# Patient Record
Sex: Male | Born: 1980 | Hispanic: Yes | Marital: Married | Smoking: Current some day smoker
Health system: Southern US, Community
[De-identification: ages and names within clinical notes are randomized; demographics above are authoritative.]

## PROBLEM LIST (undated history)

## (undated) DIAGNOSIS — E7800 Pure hypercholesterolemia, unspecified: Secondary | ICD-10-CM

## (undated) DIAGNOSIS — I10 Essential (primary) hypertension: Secondary | ICD-10-CM

## (undated) HISTORY — PX: FINGER SURGERY: SHX640

---

## 2013-03-17 ENCOUNTER — Emergency Department (HOSPITAL_COMMUNITY): Payer: Worker's Compensation

## 2013-03-17 ENCOUNTER — Encounter (HOSPITAL_COMMUNITY): Payer: Self-pay

## 2013-03-17 ENCOUNTER — Emergency Department (HOSPITAL_COMMUNITY)
Admission: EM | Admit: 2013-03-17 | Discharge: 2013-03-17 | Disposition: A | Discharge status: Home / Self Care | Payer: Worker's Compensation | Attending: Emergency Medicine | Admitting: Emergency Medicine

## 2013-03-17 DIAGNOSIS — IMO0002 Reserved for concepts with insufficient information to code with codable children: Secondary | ICD-10-CM | POA: Insufficient documentation

## 2013-03-17 DIAGNOSIS — Y9389 Activity, other specified: Secondary | ICD-10-CM | POA: Insufficient documentation

## 2013-03-17 DIAGNOSIS — F17200 Nicotine dependence, unspecified, uncomplicated: Secondary | ICD-10-CM | POA: Insufficient documentation

## 2013-03-17 DIAGNOSIS — W2209XA Striking against other stationary object, initial encounter: Secondary | ICD-10-CM | POA: Insufficient documentation

## 2013-03-17 DIAGNOSIS — Y929 Unspecified place or not applicable: Secondary | ICD-10-CM | POA: Insufficient documentation

## 2013-03-17 DIAGNOSIS — Z23 Encounter for immunization: Secondary | ICD-10-CM | POA: Insufficient documentation

## 2013-03-17 MED ORDER — OXYCODONE-ACETAMINOPHEN 5-325 MG PO TABS: 2.0000 | ORAL_TABLET | ORAL | Status: DC | PRN

## 2013-03-17 MED ORDER — ONDANSETRON HCL 4 MG/2ML IJ SOLN
4.0000 mg | Freq: Once | INTRAMUSCULAR | Status: AC
  Administered 2013-03-17: 4 mg via INTRAVENOUS
  Filled 2013-03-17: qty 2

## 2013-03-17 MED ORDER — LIDOCAINE HCL (PF) 1 % IJ SOLN
INTRAMUSCULAR | Status: AC
  Filled 2013-03-17: qty 5

## 2013-03-17 MED ORDER — MORPHINE SULFATE 4 MG/ML IJ SOLN
4.0000 mg | Freq: Once | INTRAMUSCULAR | Status: AC
  Administered 2013-03-17: 4 mg via INTRAVENOUS
  Filled 2013-03-17: qty 1

## 2013-03-17 MED ORDER — CEPHALEXIN 500 MG PO CAPS: 500.0000 mg | ORAL_CAPSULE | Freq: Four times a day (QID) | ORAL | Status: DC

## 2013-03-17 MED ORDER — CEFAZOLIN SODIUM 1-5 GM-% IV SOLN
1.0000 g | Freq: Once | INTRAVENOUS | Status: AC
  Administered 2013-03-17: 1 g via INTRAVENOUS
  Filled 2013-03-17: qty 50

## 2013-03-17 MED ORDER — TETANUS-DIPHTH-ACELL PERTUSSIS 5-2.5-18.5 LF-MCG/0.5 IM SUSP
0.5000 mL | Freq: Once | INTRAMUSCULAR | Status: AC
  Administered 2013-03-17: 0.5 mL via INTRAMUSCULAR
  Filled 2013-03-17: qty 0.5

## 2013-03-17 NOTE — ED Notes (Signed)
Pt states he was changing a tie and cut his finger. Pt has avulsion to right index finger

## 2013-03-17 NOTE — ED Provider Notes (Signed)
CSN: [id number]     Arrival date & time 03/17/13  0865 History  This chart was scribed for Joya Gaskins, MD by Bennett Scrape, ED Scribe. This patient was seen in room APA03/APA03 and the patient's care was started at 7:16 AM.   Chief Complaint  Patient presents with  . Laceration  Patient gave verbal permission to utilize photo for medical documentation only The image was not stored on any personal device   Patient is a 32 y.o. male presenting with skin laceration. The history is provided by the patient. No language interpreter was used.  Laceration Location:  Finger Finger laceration location:  R little finger Quality: avulsion   Bleeding: controlled   Time since incident:  1 hour Laceration mechanism:  Blunt object (car jack) Pain details:    Severity:  Moderate   Timing:  Constant Foreign body present:  No foreign bodies Relieved by:  Nothing Tetanus status:  Out of date   HPI Comments: MORONI NESTER is a 32 y.o. male who presents to the Emergency Department complaining of right pinky laceration that occurred this morning around 6:15 AM. He was trying to change a tire and the car jack slipped and came down on his right pinky. He denies any recent fevers, chills, emesis and diarrhea. He denies having any chronic medical conditions and denies being on any daily medications. His TD vaccination is not UTD. He denies any known allergies to antibiotics.   PMH - none Pt speaks english without difficulty  History reviewed. No pertinent past surgical history. No family history on file. History  Substance Use Topics  . Smoking status: Current Some Day Smoker  . Smokeless tobacco: Not on file  . Alcohol Use: Yes    Review of Systems  Constitutional: Negative for fever.  Gastrointestinal: Negative for vomiting and diarrhea.  Skin: Positive for wound.  All other systems reviewed and are negative.    Allergies  Review of patient's allergies indicates no known  allergies.  Home Medications  No current outpatient prescriptions on file.  Triage Vitals: BP 168/127  Pulse 94  Temp(Src) 98.7 F (37.1 C) (Oral)  Resp 19  SpO2 100%  Physical Exam  Nursing note and vitals reviewed.  CONSTITUTIONAL: Well developed/well nourished HEAD: Normocephalic/atraumatic EYES: EOMI/PERRL ENMT: Mucous membranes moist NECK: supple no meningeal signs SPINE:entire spine nontender CV: S1/S2 noted, no murmurs/rubs/gallops noted LUNGS: Lungs are clear to auscultation bilaterally, no apparent distress NEURO: Pt is awake/alert, moves all extremitiesx4 EXTREMITIES: pulses normal, full ROM, see photo below SKIN: warm, color normal PSYCH: no abnormalities of mood noted        ED Course  Procedures   DIAGNOSTIC STUDIES: Oxygen Saturation is 100% on room air, normal by my interpretation.    COORDINATION OF CARE: 7:19 AM-Pt gave me permission to add a photo of the injury to his chart.  7:22 AM-Discussed treatment plan which includes morphine for pain control and consult with orthopedist with pt at bedside and pt agreed to plan.   Labs Review Labs Reviewed - No data to display Imaging Review Dg Finger Little Right  03/17/2013   CLINICAL DATA:  Injured changing a tire, amputation at tip of little finger  EXAM: RIGHT LITTLE FINGER 2+V  COMPARISON:  None  FINDINGS: Scattered dressing artifacts.  Soft tissue amputation through distal aspect of distal phalanx right little finger.  Comminuted fracture at tuft of distal phalanx.  Joint spaces preserved.  Osseous mineralization normal.  No additional fracture dislocation identified.  IMPRESSION: Partial amputation of the distal phalanx of the right little finger with a comminuted fracture involving the tuft of distal phalanx.   Electronically Signed   By: Ulyses Southward M.D.   On: 03/17/2013 08:10     Wound was cleansed extensively He was given antibiotics I spoke to dr Hilda Lias.  We discussed his exam as well as  imaging He recommends bandage with nonadherent dressing, antibiotics, pain control and will see in 3 days Pt is agreeable with this plan MDM  No diagnosis found. Nursing notes including past medical history and social history reviewed and considered in documentation xrays reviewed and considered   I personally performed the services described in this documentation, which was scribed in my presence. The recorded information has been reviewed and is accurate.       Joya Gaskins, MD 03/17/13 [phone]

## 2013-03-17 NOTE — ED Notes (Signed)
Wound cleansed with wound cleanser, soaked in NS and peroxide.  Dressed with xeroform, telfa and kling with tight dressing.  Instructions given for dressing change.  Pt verbalized understanding.

## 2013-05-30 ENCOUNTER — Ambulatory Visit (HOSPITAL_COMMUNITY)
Admission: RE | Admit: 2013-05-30 | Discharge: 2013-05-30 | Disposition: A | Discharge status: Home / Self Care | Payer: Worker's Compensation | Source: Ambulatory Visit | Attending: Orthopaedic Surgery | Admitting: Orthopaedic Surgery

## 2013-05-30 DIAGNOSIS — M25549 Pain in joints of unspecified hand: Secondary | ICD-10-CM | POA: Insufficient documentation

## 2013-05-30 DIAGNOSIS — S62639B Displaced fracture of distal phalanx of unspecified finger, initial encounter for open fracture: Secondary | ICD-10-CM | POA: Insufficient documentation

## 2013-05-30 DIAGNOSIS — M79641 Pain in right hand: Secondary | ICD-10-CM

## 2013-05-30 DIAGNOSIS — M6281 Muscle weakness (generalized): Secondary | ICD-10-CM | POA: Insufficient documentation

## 2013-05-30 DIAGNOSIS — M7989 Other specified soft tissue disorders: Secondary | ICD-10-CM | POA: Insufficient documentation

## 2013-05-30 DIAGNOSIS — IMO0001 Reserved for inherently not codable concepts without codable children: Secondary | ICD-10-CM | POA: Insufficient documentation

## 2013-05-30 DIAGNOSIS — S68116A Complete traumatic metacarpophalangeal amputation of right little finger, initial encounter: Secondary | ICD-10-CM | POA: Insufficient documentation

## 2013-05-30 NOTE — Evaluation (Signed)
Occupational Therapy Evaluation  Patient Details  Name: Jason Rich MRN: [id number] Date of Birth: March 28, 1981  Today's Date: 05/30/2013 Time: 1315-1350 OT Time Calculation (min): 35 min OT Evaluation 1315-1335 20' Hep 1335-1350 15'  Visit#: 1 of 12  Re-eval: 06/27/13  Assessment Diagnosis: S/P Right Small Finger Distal Phalanx Fracture and Amputation Next MD Visit: unknown Prior Therapy: n/a   Authorization: workers compensation   Authorization Time Period: 4 weeks (06/27/13)   Authorization Visit#: 1 of 12   Past Medical History: No past medical history on file. Past Surgical History: No past surgical history on file.  Subjective Symptoms/Limitations Symptoms: S:  It hurts to use my finger at all.  Pertinent History: Jason Rich was changing a tire at work and the jack slipped, fracturing his right small finger distal phalanx and amputating the tip of his small digit.  His accident occurred on 03/17/13.  He consulted with Dr. Hilda Lias and has been referred to occupational therapy for evaluation and treatment.   Special Tests: FOTO scored 42. Patient Stated Goals: To get rid of the pain.  Pain Assessment Currently in Pain?: Yes Pain Score: 7  Pain Location: Finger (Comment which one) (small) Pain Orientation: Right;Distal Pain Type: Acute pain  Precautions/Restrictions  Precautions Precautions: None Restrictions Weight Bearing Restrictions: No  Balance Screening Balance Screen Has the patient fallen in the past 6 months: No Has the patient had a decrease in activity level because of a fear of falling? : No Is the patient reluctant to leave their home because of a fear of falling? : No  Prior Functioning  Home Living Additional Comments: Patient lives with his wife and 2 children  Prior Function Driving: Yes Vocation: Full time employment Vocation Requirements: Armed forces operational officer Comments: enjoys spending time with his  kids  Assessment ADL/Vision/Perception ADL ADL Comments: unable to grip or squeeze with his right hand, unable to use tools and equipment at work. Dominant Hand: Right Praxis Praxis: Intact  Cognition/Observation Cognition Overall Cognitive Status: Within Functional Limits for tasks assessed Observation/Other Assessments Observations: tip of small finger amputated with mod-max scar tissue  Sensation/Coordination/Edema Sensation Light Touch:  (volar distal phalanx diminshed protective sensation, tip los) Additional Comments: volar distal phalanx diminshed protective sensation, tip loss of protective sensation  Coordination Gross Motor Movements are Fluid and Coordinated: Yes Fine Motor Movements are Fluid and Coordinated: Yes Edema Edema: PIPJ right 6.0 cm left 5.4 cm   Additional Assessments RUE AROM (degrees) RUE Overall AROM Comments: assessed in seated Right Wrist Extension: 50 Degrees Right Wrist Flexion: 80 Degrees Right Composite Finger Flexion:  (MCPJ 82 PIPJ 60 lacks 5 extension DIPJ 30) RUE Strength RUE Overall Strength Comments: assessed in seated 3 point pinch with small, ring, thumb Grip (lbs): 46 (125) Lateral Pinch: 12 lbs (22) 3 Point Pinch: 0.5 lbs (10) Palpation Palpation: moderate fascial restrictions and mod-max scar restrictions  Right Hand Strength - Pinch (lbs) Lateral Pinch: 12 lbs (22) 3 Point Pinch: 0.5 lbs (10)   Exercise/Treatments    Manual Therapy Manual Therapy: Edema management Edema Management: wrapped right small digit with coban for edema control and educated patient to complete at home, voiced understanding of technique. Myofascial Release: MFR and scar release to right small digit and hand  Occupational Therapy Assessment and Plan OT Assessment and Plan Clinical Impression Statement: A:  Patient presents s/p right distal digit amputation of right small finger.  His deficits are causing decreased independence with B/IADLs, work,  and leisure activities.   Pt will benefit  from skilled therapeutic intervention in order to improve on the following deficits: Decreased skin integrity;Decreased strength;Increased edema;Impaired sensation;Decreased range of motion;Pain Rehab Potential: Good OT Frequency: Min 2X/week OT Duration: 6 weeks OT Treatment/Interventions: Self-care/ADL training;Therapeutic exercise;Neuromuscular education;Splinting;Modalities;Manual therapy;Therapeutic activities;Patient/family education OT Plan: P: Skilled OT intervention to decrease pain, restrictions, edema, and hypersensitvity and improve AROM, strength, and functional use of RUE with daily activities.  Treatment Plan:  MFR and scar massage, edema control, desensitization/sensory reintegration, PROM progressing to AROM, grip activities.     Goals Short Term Goals Time to Complete Short Term Goals: 3 weeks Short Term Goal 1: Patient will be educated on a HEP. Short Term Goal 2: Patient will improve PROM of right small finger and wrist to [facility] for increased ability to grasp work tools.  Short Term Goal 3: Patient will improve right grip strength by 20 pounds and pinch strength by 3 pounds for increased abiilty to open containers. Short Term Goal 4: Patient will improve sensation to dulled light touch sensation throughout small digit for increased safety at work. Short Term Goal 5: Patient will decrease edema by .3 cm in his right small finger. Additional Short Term Goals?: Yes Short Term Goal 6: Patient will decrease pain to 5/10 in his right small finger while working.  Short Term Goal 7: Patient will decrease fascial restrictions to minimal in his right hand.  Long Term Goals Time to Complete Long Term Goals: 6 weeks Long Term Goal 1: Patient will return to prior level of independence with all daily, work, and leisure activities.  Long Term Goal 2: Patient will improve AROM of right small finger and wrist to [facility] for increased ability to grasp work  tools.  Long Term Goal 3: Patient will improve right grip strength by 50 pounds and pinch strength by 7 pounds for increased abiilty to manipulate power tools. Long Term Goal 4: Patient will improve sensation to intact hroughout small digit for increased safety at work. Long Term Goal 5: Patient will decrease edema by .6 cm in his right small finger. Additional Long Term Goals?: Yes Long Term Goal 6: Patient will decrease pain to 3/10 in his right small finger while working.  Long Term Goal 7: Patient will decrease fascial restrictions to trace in his right hand.   Problem List Patient Active Problem List   Diagnosis Date Noted  . 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 Fracture of finger, distal phalanx, right, open 05/30/2013  . Pain in joint, hand 05/30/2013    End of Session Activity Tolerance: Patient tolerated treatment well General Behavior During Therapy: [facility] for tasks assessed/performed OT Plan of Care OT Home Exercise Plan: tendon glides, sensory, scar massage, edema control, AROM  OT Patient Instructions: scanned Consulted and Agree with Plan of Care: Patient  GO    Shirlean Mylar, OTR/L  05/30/2013, 5:58 PM  Physician Documentation Your signature is required to indicate approval of the treatment plan as stated above.  Please sign and either send electronically or make a copy of this report for your files and return this physician signed original.  Please mark one 1.__approve of plan  2. ___approve of plan with the following conditions.   ______________________________  _____________________ Physician Signature                                                                                                             Date

## 2013-06-01 ENCOUNTER — Ambulatory Visit (HOSPITAL_COMMUNITY): Payer: Self-pay | Admitting: Specialist

## 2013-06-06 ENCOUNTER — Ambulatory Visit (HOSPITAL_COMMUNITY)
Admission: RE | Admit: 2013-06-06 | Discharge: 2013-06-06 | Disposition: A | Discharge status: Home / Self Care | Payer: Worker's Compensation | Source: Ambulatory Visit | Attending: *Deleted | Admitting: *Deleted

## 2013-06-06 DIAGNOSIS — M25549 Pain in joints of unspecified hand: Secondary | ICD-10-CM | POA: Insufficient documentation

## 2013-06-06 DIAGNOSIS — M6281 Muscle weakness (generalized): Secondary | ICD-10-CM | POA: Insufficient documentation

## 2013-06-06 DIAGNOSIS — IMO0001 Reserved for inherently not codable concepts without codable children: Secondary | ICD-10-CM | POA: Insufficient documentation

## 2013-06-06 NOTE — Progress Notes (Signed)
Occupational Therapy Treatment Patient Details  Name: Jason Rich MRN: [id number] Date of Birth: [birth year]  Today's Date: 06/06/2013 Time: 7829-5621 OT Time Calculation (min): 40 min Manual 1523-1540 (17') Selfcare 1540-1550 (10') TherExercises 1550-1603 (13')  Visit#: 2 of 12  Re-eval: 06/27/13    Authorization: workers compensation   Authorization Time Period: 4 weeks (06/27/13)   Authorization Visit#: 2 of 12  Subjective Symptoms/Limitations Symptoms: S:  I think It hurts a little more now than it did when it happened  Pain Assessment Currently in Pain?: Yes Pain Score: 9  Pain Location: Finger (Comment which one) (right fifth digit ) Pain Orientation: Right;Distal Pain Type: Acute pain Multiple Pain Sites: No   Exercise/Treatments Hand Exercises MCPJ Flexion: PROM;AAROM;10 reps MCPJ Extension: PROM;AAROM;10 reps PIPJ Flexion: PROM;AROM;10 reps PIPJ Extension: PROM;AAROM;10 reps DIPJ Flexion: PROM;AAROM;10 reps DIPJ Extension: PROM;AAROM;10 reps Digit Composite Abduction: AAROM;10 reps Digit Composite Adduction: AAROM;10 reps Opposition: AAROM;10 reps     Manual Therapy Myofascial Release: MFR and scar release to right small digit and hand Splinting Splinting: #1 stockinette with knotted end applied to 5th digit for desensitization.  Educated on contrast bath for home for edema management and sensitivity.   Occupational Therapy Assessment and Plan OT Assessment and Plan Clinical Impression Statement: A:  Patient with continued hypersensitivity of distal 5th digit.  Demos increased range of motion  OT Plan: P:  MFR and scar massage, edema control, desensitization/sensory reintegration, PROM progressing to AROM, grip activities.     Goals Short Term Goals Short Term Goal 1: Patient will be educated on a HEP. Short Term Goal 1 Progress: Progressing toward goal Short Term Goal 2: Patient will improve PROM of right small finger and wrist to [facility] for increased  ability to grasp work tools.  Short Term Goal 2 Progress: Progressing toward goal Short Term Goal 3: Patient will improve right grip strength by 20 pounds and pinch strength by 3 pounds for increased abiilty to open containers. Short Term Goal 3 Progress: Progressing toward goal Short Term Goal 4: Patient will improve sensation to dulled light touch sensation throughout small digit for increased safety at work. Short Term Goal 4 Progress: Progressing toward goal Short Term Goal 5: Patient will decrease edema by .3 cm in his right small finger. Short Term Goal 5 Progress: Progressing toward goal Additional Short Term Goals?: Yes Short Term Goal 6: Patient will decrease pain to 5/10 in his right small finger while working.  Short Term Goal 6 Progress: Progressing toward goal Short Term Goal 7: Patient will decrease fascial restrictions to minimal in his right hand.  Short Term Goal 7 Progress: Progressing toward goal Long Term Goals Long Term Goal 1: Patient will return to prior level of independence with all daily, work, and leisure activities.  Long Term Goal 1 Progress: Progressing toward goal Long Term Goal 2: Patient will improve AROM of right small finger and wrist to [facility] for increased ability to grasp work tools.  Long Term Goal 2 Progress: Progressing toward goal Long Term Goal 3: Patient will improve right grip strength by 50 pounds and pinch strength by 7 pounds for increased abiilty to manipulate power tools. Long Term Goal 3 Progress: Progressing toward goal Long Term Goal 4: Patient will improve sensation to intact hroughout small digit for increased safety at work. Long Term Goal 4 Progress: Progressing toward goal Long Term Goal 5: Patient will decrease edema by .6 cm in his right small finger. Long Term Goal 5 Progress: Progressing toward goal Additional  Long Term Goals?: Yes Long Term Goal 6: Patient will decrease pain to 3/10 in his right small finger while working.  Long  Term Goal 6 Progress: Progressing toward goal Long Term Goal 7: Patient will decrease fascial restrictions to trace in his right hand.  Long Term Goal 7 Progress: Progressing toward goal  Problem List Patient Active Problem List   Diagnosis Date Noted  . 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 Fracture of finger, distal phalanx, right, open 05/30/2013  . Pain in joint, hand 05/30/2013    End of Session Activity Tolerance: Patient tolerated treatment well General Behavior During Therapy: [facility] for tasks assessed/performed  GO    Velora Mediate, OTR/L 06/06/2013, 5:17 PM

## 2013-06-13 ENCOUNTER — Ambulatory Visit (HOSPITAL_COMMUNITY): Payer: Self-pay | Admitting: Occupational Therapy

## 2013-06-16 ENCOUNTER — Ambulatory Visit (HOSPITAL_COMMUNITY)
Admission: RE | Admit: 2013-06-16 | Discharge: 2013-06-16 | Disposition: A | Discharge status: Home / Self Care | Payer: Worker's Compensation | Source: Ambulatory Visit | Attending: Orthopaedic Surgery | Admitting: Orthopaedic Surgery

## 2013-06-16 DIAGNOSIS — X58XXXA Exposure to other specified factors, initial encounter: Secondary | ICD-10-CM | POA: Insufficient documentation

## 2013-06-16 DIAGNOSIS — S68116A Complete traumatic metacarpophalangeal amputation of right little finger, initial encounter: Secondary | ICD-10-CM

## 2013-06-16 DIAGNOSIS — M79609 Pain in unspecified limb: Secondary | ICD-10-CM | POA: Insufficient documentation

## 2013-06-16 DIAGNOSIS — IMO0001 Reserved for inherently not codable concepts without codable children: Secondary | ICD-10-CM | POA: Insufficient documentation

## 2013-06-16 DIAGNOSIS — S68118A Complete traumatic metacarpophalangeal amputation of other finger, initial encounter: Secondary | ICD-10-CM | POA: Insufficient documentation

## 2013-06-16 DIAGNOSIS — M25549 Pain in joints of unspecified hand: Secondary | ICD-10-CM

## 2013-06-16 DIAGNOSIS — S62639B Displaced fracture of distal phalanx of unspecified finger, initial encounter for open fracture: Secondary | ICD-10-CM | POA: Insufficient documentation

## 2013-06-16 NOTE — Progress Notes (Signed)
Occupational Therapy Treatment Patient Details  Name: Jason SkeenBenito Rich MRN: [id number] Date of Birth: 02/27/1981  Today's Date: 06/16/2013 Time: 1435-1510 OT Time Calculation (min): 35 min Manual therapy [id number] 20' Therapeutic exercises 1455-1510 15'  Visit#: 3 of 12  Re-eval: 06/27/13    Authorization: workers compensation   Authorization Time Period: 4 weeks (06/27/13)   Authorization Visit#: 3 of 12  Subjective S:  I dont wrap it all the time.  It hurts when I unwrap it.  Pain Assessment Currently in Pain?: Yes Pain Score: 9  Pain Location: Finger (Comment which one) (right small digit)  Precautions/Restrictions   progress as tolerated  Exercise/Treatments Hand Exercises MCPJ Flexion: PROM;AROM;10 reps MCPJ Extension: PROM;AROM;10 reps PIPJ Flexion: PROM;AROM;10 reps PIPJ Extension: PROM;AROM;10 reps DIPJ Flexion: PROM;AROM;10 reps DIPJ Extension: PROM;AROM;10 reps Joint Blocking Exercises: P/AROM each joint of small finger x 5 Digit Composite Abduction: AROM;Strengthening;10 reps (AROM with sponge, strengthening with pink putty) Digit Composite Adduction: AROM;10 reps Theraputty: Flatten;Roll;Grip;Pinch Theraputty - Flatten: pink - vg to utilize small digit Theraputty - Roll: pink Theraputty - Grip: pink - gripping in supinated and pronated position Theraputty - Pinch: pinch with small, ring, thumb combo Sponges: 11, vg to use small finger Sensory Retraining: used qtip to apply pressure to fingertip scar region for sensory input and desensitization and educated for HEP.     Manual Therapy Manual Therapy: Edema management Edema Management: reeducated patient on proper wrapping technique (distal to proximal) for edema control.  Reeducated on importance of wrapping on continual basis to maintain control of edema vs allowing swelling to increase and then wrapping.  Myofascial Release: MFR and scar release to right small digit and hand  Occupational Therapy Assessment  and Plan OT Assessment and Plan Clinical Impression Statement: A:  With active grasp of an object, small finger overlaps ring finger.  Patient completed 10 reps of digit abduction and then repeated grasp activity with improved alignment of digits noted.  OT Plan: P:  Follow up on HEP of scar desensitization and use of coban for edema control on a continual basis.  Improve grasp by grasping greater than 11 sponges at one time.    Goals Short Term Goals Short Term Goal 1: Patient will be educated on a HEP. Short Term Goal 1 Progress: Progressing toward goal Short Term Goal 2: Patient will improve PROM of right small finger and wrist to [facility] for increased ability to grasp work tools.  Short Term Goal 2 Progress: Progressing toward goal Short Term Goal 3: Patient will improve right grip strength by 20 pounds and pinch strength by 3 pounds for increased abiilty to open containers. Short Term Goal 3 Progress: Progressing toward goal Short Term Goal 4: Patient will improve sensation to dulled light touch sensation throughout small digit for increased safety at work. Short Term Goal 4 Progress: Progressing toward goal Short Term Goal 5: Patient will decrease edema by .3 cm in his right small finger. Short Term Goal 5 Progress: Progressing toward goal Additional Short Term Goals?: Yes Short Term Goal 6: Patient will decrease pain to 5/10 in his right small finger while working.  Short Term Goal 6 Progress: Progressing toward goal Short Term Goal 7: Patient will decrease fascial restrictions to minimal in his right hand.  Short Term Goal 7 Progress: Progressing toward goal Long Term Goals Long Term Goal 1: Patient will return to prior level of independence with all daily, work, and leisure activities.  Long Term Goal 1 Progress: Progressing toward goal Long Term  Goal 2: Patient will improve AROM of right small finger and wrist to [facility] for increased ability to grasp work tools.  Long Term Goal 2  Progress: Progressing toward goal Long Term Goal 3: Patient will improve right grip strength by 50 pounds and pinch strength by 7 pounds for increased abiilty to manipulate power tools. Long Term Goal 3 Progress: Progressing toward goal Long Term Goal 4: Patient will improve sensation to intact hroughout small digit for increased safety at work. Long Term Goal 4 Progress: Progressing toward goal Long Term Goal 5: Patient will decrease edema by .6 cm in his right small finger. Long Term Goal 5 Progress: Progressing toward goal Additional Long Term Goals?: Yes Long Term Goal 6: Patient will decrease pain to 3/10 in his right small finger while working.  Long Term Goal 6 Progress: Progressing toward goal Long Term Goal 7: Patient will decrease fascial restrictions to trace in his right hand.  Long Term Goal 7 Progress: Progressing toward goal  Problem List Patient Active Problem List   Diagnosis Date Noted  . 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 Fracture of finger, distal phalanx, right, open 05/30/2013  . Pain in joint, hand 05/30/2013    End of Session Activity Tolerance: Patient tolerated treatment well General Behavior During Therapy: [facility] for tasks assessed/performed OT Plan of Care OT Home Exercise Plan: reviewed coban wrap, technique, and use of coban on continual basis. Consulted and Agree with Plan of Care: Patient  GO    Shirlean Mylar, OTR/L  06/16/2013, 3:26 PM

## 2013-06-20 ENCOUNTER — Ambulatory Visit (HOSPITAL_COMMUNITY)
Admission: RE | Admit: 2013-06-20 | Discharge: 2013-06-20 | Disposition: A | Discharge status: Home / Self Care | Payer: Worker's Compensation | Source: Ambulatory Visit | Attending: Occupational Therapy | Admitting: Occupational Therapy

## 2013-06-20 NOTE — Progress Notes (Signed)
Occupational Therapy Treatment Patient Details  Name: Jason Rich MRN: [id number] Date of Birth: 01/08/1981  Today's Date: 06/20/2013 Time: [id number] OT Time Calculation (min): 36 min Manual 1517-1530 (13') Therapeutic Exercises 1530-1553 (23')  Visit#: 4 of 12  Re-eval: 06/27/13   Authorization: workers compensation   Authorization Time Period: 4 weeks (06/27/13)   Authorization Visit#: 4 of 12  Subjective Symptoms/Limitations Symptoms: S: It's feeling better today - but I'm also starting to feel more with it." Pain Assessment Currently in Pain?: Yes Pain Score: 9  Pain Location: Finger (Comment which one) Pain Orientation: Right;Distal Pain Type: Acute pain Effect of Pain on Daily Activities: Just finished work outside in the cold - increases pain  Exercise/Treatments Hand Exercises MCPJ Flexion: PROM;AROM;10 reps MCPJ Extension: PROM;AROM;10 reps PIPJ Flexion: PROM;AROM;10 reps PIPJ Extension: PROM;AROM;10 reps DIPJ Flexion: PROM;AROM;10 reps DIPJ Extension: PROM;AROM;10 reps Digit Composite Abduction: AROM;Strengthening;20 reps Opposition: AAROM;AROM;15 reps (Opposed 1st/5th over pipecleaner - pulled through 5x) Theraputty: Flatten;Roll;Grip;Pinch Theraputty - Flatten: pink - vg to utilize small digit Theraputty - Pinch: pinch with small, ring, thumb combo     Manual Therapy Manual Therapy: Edema management (carpal stretches to facilitate opposition) Myofascial Release: MFR and scar release to right small digit and hand Splinting Splinting: digisleeve given to soften scar tissue and decrease swelling in R 5th digit  Occupational Therapy Assessment and Plan OT Assessment and Plan Clinical Impression Statement: A: Pt noted that he is having more sensation in his R 5th digit, but that sensation is currently pain. Pt is showing improved sensation tolerance duirng tx session. W cont abduction exercises, digit alignment during grasp has improved.  Pt tolerated well  digit opposition task with pipecleaner. OT Plan: P: Follow up on usage/effects of digitsleeve for scar softening and edema control. Improve grasp by grasping greater than 11 sponges at one time.   Goals Short Term Goals Short Term Goal 1: Patient will be educated on a HEP. Short Term Goal 1 Progress: Progressing toward goal Short Term Goal 2: Patient will improve PROM of right small finger and wrist to [facility] for increased ability to grasp work tools.  Short Term Goal 2 Progress: Progressing toward goal Short Term Goal 3: Patient will improve right grip strength by 20 pounds and pinch strength by 3 pounds for increased abiilty to open containers. Short Term Goal 3 Progress: Progressing toward goal Short Term Goal 4: Patient will improve sensation to dulled light touch sensation throughout small digit for increased safety at work. Short Term Goal 4 Progress: Progressing toward goal Short Term Goal 5: Patient will decrease edema by .3 cm in his right small finger. Short Term Goal 5 Progress: Progressing toward goal Additional Short Term Goals?: Yes Short Term Goal 6: Patient will decrease pain to 5/10 in his right small finger while working.  Short Term Goal 6 Progress: Progressing toward goal Short Term Goal 7: Patient will decrease fascial restrictions to minimal in his right hand.  Short Term Goal 7 Progress: Progressing toward goal Long Term Goals Long Term Goal 1: Patient will return to prior level of independence with all daily, work, and leisure activities.  Long Term Goal 2: Patient will improve AROM of right small finger and wrist to [facility] for increased ability to grasp work tools.  Long Term Goal 3: Patient will improve right grip strength by 50 pounds and pinch strength by 7 pounds for increased abiilty to manipulate power tools. Long Term Goal 4: Patient will improve sensation to intact hroughout small digit for  increased safety at work. Long Term Goal 5: Patient will decrease edema by  .6 cm in his right small finger. Additional Long Term Goals?: Yes Long Term Goal 6: Patient will decrease pain to 3/10 in his right small finger while working.  Long Term Goal 7: Patient will decrease fascial restrictions to trace in his right hand.   Problem List Patient Active Problem List   Diagnosis Date Noted  . 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 Fracture of finger, distal phalanx, right, open 05/30/2013  . Pain in joint, hand 05/30/2013    End of Session Activity Tolerance: Patient tolerated treatment well General Behavior During Therapy: [facility] for tasks assessed/performed  GO   Marry Guan, MS, OTR/L [phone]  06/20/2013, 4:17 PM

## 2013-06-22 ENCOUNTER — Ambulatory Visit (HOSPITAL_COMMUNITY): Payer: Self-pay | Admitting: Specialist

## 2013-06-27 ENCOUNTER — Ambulatory Visit (HOSPITAL_COMMUNITY): Payer: Self-pay | Admitting: Occupational Therapy

## 2013-06-29 ENCOUNTER — Ambulatory Visit (HOSPITAL_COMMUNITY): Payer: Self-pay | Admitting: Specialist

## 2013-07-04 ENCOUNTER — Ambulatory Visit (HOSPITAL_COMMUNITY)
Admission: RE | Admit: 2013-07-04 | Discharge: 2013-07-04 | Disposition: A | Discharge status: Home / Self Care | Payer: Worker's Compensation | Source: Ambulatory Visit | Attending: *Deleted | Admitting: *Deleted

## 2013-07-04 NOTE — Evaluation (Signed)
Occupational Therapy Re-Evaluation  Patient Details  Name: Jason Rich MRN: [id number] Date of Birth: Oct 23, 1980  Today's Date: 07/04/2013 Time: 1518-1600 OT Time Calculation (min): 42 min Reassessment 1518-1536 (18') Self Care 1536-1600 (24')  Visit#: 5 of 12  Re-eval: 07/25/13     Authorization: workers compensation   Authorization Time Period: 4 weeks (06/27/13) - to be reassessed with case manager pending MD appt 07/06/2013  Authorization Visit#: 5 of 12   Past Medical History: No past medical history on file. Past Surgical History: No past surgical history on file.  Subjective Symptoms/Limitations Symptoms: S:  When it is cold i have pain all the time... it is warm today so it is good.  ( patient states when it is cold outside and he hits anything with his right 5th digit, or ie. hammer, he feels and electric shock through his entire hand) Pain Assessment Currently in Pain?: Yes Pain Score: 8  (with bending movement of digit flexion ) Pain Location: Finger (Comment which one) (right fifth digit) Pain Orientation: Right Pain Type: Acute pain  Precautions/Restrictions  Precautions Precautions: None Restrictions Weight Bearing Restrictions: No  Balance Screening Balance Screen Has the patient fallen in the past 6 months: No   Assessment    07/04/13 1500  Assessment  Diagnosis S/P Right Small Finger Distal Phalanx Fracture and Amputation  Next MD Visit 05/06/2014  Precautions  Precautions None  Restrictions  Weight Bearing Restrictions No  Balance Screen  Has the patient fallen in the past 6 months No  ADL  ADL Comments patient states she he grasps objects tightly he feels pain through the ulnar side of his hand   Cognition  Overall Cognitive Status Within Functional Limits for tasks assessed  Observation/Other Assessments  Observations well healed tip with medial aspect of finger nail digging in his skin   Sensation  Additional Comments continues with  loss of sensation distal tip right 5th digit  Coordination  Gross Motor Movements are Fluid and Coordinated Yes  Fine Motor Movements are Fluid and Coordinated Yes  Edema  Edema PIPJ right 5.8 (6.0) left 5.2 (5.4);  DIPJ right 4.8, left 4.6  RUE AROM (degrees)  RUE Overall AROM Comments assessed in seated  Right Wrist Extension 76 Degrees (50)  Right Wrist Flexion 78 Degrees (80)  Right Composite Finger Flexion (R 5th MCPJ 86; PIP 88/-3; DIP 74)  RUE Strength  RUE Overall Strength Comments assessed in seated 3 point pinch with small, ring, thumb  Grip (lbs) 89 (48 (left 118 (125)))  Lateral Pinch 25 lbs (12 (left 22 (22)))  3 Point Pinch 8 lbs (0.5 (left 10 (10)))  Palpation  Palpation trace-min fascial restrictions and mod-max scar restrictions   Written Expression  Dominant Hand Right    Exercise/Treatments Hand Exercises Sensory Retraining: use of various textures (towels, pipecleaner, denim, sweatshirt material); tapping with thumb, on table, in putty     Activities of Daily Living Activities of Daily Living: educated patient on managing nail to prevent snagging, for pain management as well as encouraging patient to continue desensitization at home.   Occupational Therapy Assessment and Plan OT Assessment and Plan Clinical Impression Statement: A:  Reassessment completed this date.  Patient has met 5/7 STG, partially meeting sensation goal and not meeting pain goal;  Met 4/7 LTG, partially meeting stregth goal and not meeting pain or sensation goals.  He continues wtih decreased sensation of distal tip of right 5th digit and reports continued increased pain with temperature extremes and vibration/impact  to dominant right hand (ie. utilizing work tools).  Performed, instructed on sensitization techniques this date to continue with progress of regaining distal sensation.  Patient reports being able to engage in and actively perform all ADL, work and leisure activities with pain.   Patient for MD appointment this week.   OT Plan: P:  Per MD, pending appointment.  Will follow with case manager as needed.    Goals Short Term Goals Short Term Goal 1: Patient will be educated on a HEP. Short Term Goal 1 Progress: Met Short Term Goal 2: Patient will improve PROM of right small finger and wrist to [facility] for increased ability to grasp work tools.  Short Term Goal 2 Progress: Met Short Term Goal 3: Patient will improve right grip strength by 20 pounds and pinch strength by 3 pounds for increased abiilty to open containers. Short Term Goal 3 Progress: Met Short Term Goal 4: Patient will improve sensation to dulled light touch sensation throughout small digit for increased safety at work. (patient reports continued numbness at very distal tip of digit, however hypersensitivity has improved.  ) Short Term Goal 4 Progress: Partly met Short Term Goal 5: Patient will decrease edema by .3 cm in his right small finger. Short Term Goal 5 Progress: Met Short Term Goal 6: Patient will decrease pain to 5/10 in his right small finger while working.  (8/10) Short Term Goal 6 Progress: Not met Short Term Goal 7: Patient will decrease fascial restrictions to minimal in his right hand.  Short Term Goal 7 Progress: Met Long Term Goals Long Term Goal 1: Patient will return to prior level of independence with all daily, work, and leisure activities.  Long Term Goal 1 Progress: Met Long Term Goal 2: Patient will improve AROM of right small finger and wrist to [facility] for increased ability to grasp work tools.  Long Term Goal 2 Progress: Met Long Term Goal 3: Patient will improve right grip strength by 50 pounds and pinch strength by 7 pounds for increased abiilty to manipulate power tools. Long Term Goal 3 Progress: Partly met Long Term Goal 4: Patient will improve sensation to intact hroughout small digit for increased safety at work. Long Term Goal 4 Progress: Not met Long Term Goal 5: Patient  will decrease edema by .6 cm in his right small finger. Long Term Goal 5 Progress: Met Long Term Goal 6: Patient will decrease pain to 3/10 in his right small finger while working.  Long Term Goal 6 Progress: Not met Long Term Goal 7: Patient will decrease fascial restrictions to trace in his right hand.  Long Term Goal 7 Progress: Met  Problem List Patient Active Problem List   Diagnosis Date Noted  . 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 Fracture of finger, distal phalanx, right, open 05/30/2013  . Pain in joint, hand 05/30/2013    End of Session Activity Tolerance: Patient tolerated treatment well General Behavior During Therapy: [facility] for tasks assessed/performed  GO    Donney Rankins, OTR/L 07/04/2013, 7:46 PM  Physician Documentation Your signature is required to indicate approval of the treatment plan as stated above.  Please sign and either send electronically or make a copy of this report for your files and return this physician signed original.  Please mark one 1.__approve of plan  2. ___approve of plan with the following conditions.   ______________________________  _____________________ Physician Signature                                                                                                             Date

## 2013-07-10 ENCOUNTER — Inpatient Hospital Stay (HOSPITAL_COMMUNITY): Admission: RE | Admit: 2013-07-10 | Payer: Self-pay | Source: Ambulatory Visit | Admitting: Occupational Therapy

## 2014-06-09 ENCOUNTER — Emergency Department (HOSPITAL_COMMUNITY)
Admission: EM | Admit: 2014-06-09 | Discharge: 2014-06-09 | Disposition: A | Discharge status: Home / Self Care | Payer: Self-pay | Attending: Emergency Medicine | Admitting: Emergency Medicine

## 2014-06-09 ENCOUNTER — Encounter (HOSPITAL_COMMUNITY): Payer: Self-pay | Admitting: Emergency Medicine

## 2014-06-09 DIAGNOSIS — M79644 Pain in right finger(s): Secondary | ICD-10-CM | POA: Insufficient documentation

## 2014-06-09 DIAGNOSIS — Z792 Long term (current) use of antibiotics: Secondary | ICD-10-CM | POA: Insufficient documentation

## 2014-06-09 DIAGNOSIS — G8911 Acute pain due to trauma: Secondary | ICD-10-CM | POA: Insufficient documentation

## 2014-06-09 DIAGNOSIS — Z87828 Personal history of other (healed) physical injury and trauma: Secondary | ICD-10-CM | POA: Insufficient documentation

## 2014-06-09 DIAGNOSIS — Z720 Tobacco use: Secondary | ICD-10-CM | POA: Insufficient documentation

## 2014-06-09 MED ORDER — OXYCODONE-ACETAMINOPHEN 5-325 MG PO TABS: 1.0000 | ORAL_TABLET | ORAL | Status: DC | PRN

## 2014-06-09 NOTE — Discharge Instructions (Signed)
Dolor neuroptico (Neuropathic Pain) A menudo creemos que el dolor tiene una causa fsica. Si eliminamos la causa, el dolor debera irse. Los nervios en s mismos tambin pueden causar dolor. Esto se denomina dolor neuroptico, que implica una anormalidad del nervio. Puede ser difcil para los pacientes que lo padecen y para los profesionales que los asisten. El dolor normalmente se describe como agudo (de corta duracin) o crnico (de larga duracin). El dolor agudo se relaciona con las sensaciones fsicas que puede provocar una lesin. Puede durar unos pocos segundos o muchas semanas, pero a menudo desaparece cuando la lesin se cura. El dolor crnico dura ms que el tiempo normal de curacin. En el dolor neuroptico, las fibras nerviosas pueden lesionarse o daarse. Entonces envan seales incorrectas a otros centros de dolor. El dolor que se siente es real, pero la causa no es fcil de encontrar.  CAUSAS El dolor crnico puede ser resultado de enfermedades como la diabetes y el herpes (una infeccin relacionada con la varicela), o debido a un traumatismo, una ciruga o una amputacin. Tambin puede ocurrir sin ninguna enfermedad o lesin conocida. Los nervios envan mensajes de dolor, an cuando no haya una causa identificable de tales mensajes.   Otras causas comunes de la neuropata incluyen diabetes, sndrome del miembro fantasma, o Sndrome de Dolor Regional (SDR).  Como ocurre en todas las formas de dolor de espalda, si la neuropata no se trata correctamente, puede existir una serie de problemas asociados que pueden llevar a una espiral negativa para el paciente. Estos problemas incluyen depresin, insomnio, sensacin de miedo y ansiedad, interaccin social reducida e incapacidad para realizar actividades cotidianas o trabajar.  El ejemplo ms dramtico y misterioso de dolor neuroptico es el denominado "sndrome del miembro fantasma". Esto ocurre cuando se amputa un brazo o una pierna debido a  una enfermedad o lesin. El cerebro contina recibiendo mensajes de los nervios que originalmente transportaban los impulsos del miembro ausente. Estos nervios envan seales incorrectas y producen dolor.  Es comn que el dolor neuroptico no tenga una causa. Responde pobremente al tratamiento para el dolor. El dolor neuroptico puede ocurrir luego de:  Herpes (Infeccin del virus Herpes Zoster).  Una sensacin de quemazn duradera en la piel, causada comnmente por una lesin en el nervio perifrico.  Neuropata perifrica que es un dao generalizado en los nervios, a menudo causado por la diabetes o el alcoholismo.  Dolor del miembro fantasma luego de una amputacin.  Problemas en los nervios faciales (neuralgia del trigmino).  Esclerosis mltiple.  Distrofia Simptica Refleja.  Dolor que provoca el cncer y la quimioterapia.  Neuropata por atrapamiento, cuando el nervio sufre presin como en el sndrome del tnel carpiano.  Problemas de la espalda, la pierna y la cadera (citica).  Ciruga de la espalda o la espina dorsal.  Infeccin de HIV o SIDA, en donde los nervios se infectan con el virus. El profesional que le asiste le explicar los puntos de esta lista que pueden estar referidos a usted. SNTOMAS Las caractersticas del dolor neuroptico son:   Es un dolor intenso, agudo, similar a un shock elctrico, punzante, como si le clavaran un cuchillo.  Sensacin de pinchazos.  Sensacin profunda de quemazn, fro o dolor.  Debilidad persistente, hormigueo o debilidad.  La aparicion de dolor resultante de un toque suave u otro estmulo que normalmente no causara dolor.  Una mayor sensibilidad hacia cosas que normalmente causan dolor (un pinchazo). Es comn que exista un dolor persistente a partir de estmulos no   dolorosos como un toque suave. El dolor puede persistir por meses o aos luego de que se hayan curado los tejidos daados. Cuando esto ocurre, las seales de  dolor ya no disparan la alarma ante agresiones presentes o prximas. El sistema de alarma en s no est funcionando correctamente. Dolor neuroptico puede empeorar en lugar de mejorar con el tiempo. Para algunas personas, puede resultar en una incapacidad grave. Es importante saber que an un traumatismo grave en un miembro puede no producir una adecuada respuesta de proteccin contra el dolor.Algunas quemaduras, cortes y otras lesiones pueden pasar inadvertidas. Sin el tratamiento adecuado, estas lesiones pueden infectarse o producir discapacidad. Considere seriamente cualquier lesin y consulte a su mdico para recibir tratamiento. DIAGNSTICO Cuando tiene dolor sin causa conocida, el profesional que lo asiste probablemente le realizar preguntas especficas:   Tiene otras enfermedades, como diabetes, herpes, esclerosis mltiple, o infeccin de HIV?  Cmo describira el dolor? (El dolor neuroptico a menudo se describe como punzante, lancinante, ardiente o abrasador.)  Empeora el dolor en algn momento del da? (El dolor neuroptico generalmente empeora por la noche.)  Siente que el dolor sigue determinados trayectos fsicos?  El dolor proviene de una zona que tiene nervios lesionados o ausentes? Un ejemplo sera el dolor del miembro fantasma.  El dolor se dispara por estmulos pequeos tales como el roce con las sbanas por las noches? Estas preguntas ayudan a definir el tipo de dolor presente. Una vez que el profesional que lo asiste sepa lo que est ocurriendo podr comenzar el tratamiento. Los antiespasmdicos, las drogas antidepresivas, y distintos analgsicos parecen funcionar en algunos casos. Si est involucrada alguna otra enfermedad, como la diabetes, un tratamiento mejor de esa enfermedad puede aliviar el dolor neuroptico.  TRATAMIENTO El dolor neuroptico a menudo es de larga duracin y tiende a no responder al tratamiento de medicamentos para el dolor de tipo narctico. Puede  responder bien a otras drogas tales como anticonvulsivos y antidepresivos. Normalmente, los problemas neuropticos no se van nunca del todo, pero es posible lograr una mejora parcial con el tratamiento adecuado. Los profesionales que lo asisten tienen muchos medicamentos disponibles para tratar su problema. No se desanime si no obtiene alivio inmediato. En ocasiones deben probarse distintos medicamentos o combinaciones de ellos antes de que obtenga los resultados que espera. Asegrese de ver al profesional que lo asiste si siente dolor que no parece provenir de ningn lado y no se va. La ayuda est a su alcance.  SOLICITE ATENCIN MDICA DE INMEDIATO SI:   Hay un cambio repentino en la calidad del dolor, especialmente si observa el cambio slo en un lado del cuerpo.  Nota modificaciones en la piel, como enrojecimiento, cambios en el color a negro o prpura, hinchazn o una lcera.  No puede mover el miembro afectado. Document Released: 09/08/2007 Document Revised: 08/24/2011 [employer] Patient Information 2015 [employer]. This information is not intended to replace advice given to you by your health care provider. Make sure you discuss any questions you have with your health care provider.  

## 2014-06-09 NOTE — ED Notes (Signed)
R fifth fingertip is painful with pain radiating up arm to shoulder.  Describes pain as deep aching, not responsive to Tylenol and Ibuprofen.  History of cutting tip of finger off Oct. 2014.  Nail has grown back misshapened and medial aspect gets irritated from wearing gloves for his work.  States he has been cutting into nailbed to decrease it hanging. States pain is worse with cold temperatures.

## 2014-06-09 NOTE — ED Notes (Signed)
Pt states that he broke his finger a few months ago and now is having pain raidditating up his arm. Pt also think right 5th nail is infected.

## 2014-06-09 NOTE — ED Notes (Signed)
Patient with no complaints at this time. Respirations even and unlabored. Skin warm/dry. Discharge instructions reviewed with patient at this time. Patient given opportunity to voice concerns/ask questions. Patient discharged at this time and left Emergency Department with steady gait.   

## 2014-06-11 NOTE — ED Provider Notes (Signed)
CSN: [id number]     Arrival date & time 06/09/14  1546 History   First MD Initiated Contact with Patient 06/09/14 1704     Chief Complaint  Patient presents with  . Hand Pain     (Consider location/radiation/quality/duration/timing/severity/associated sxs/prior Treatment) The history is provided by the patient.   Jason Rich is a 33 y.o. male presenting with persistent pain in his right 5th finger since sustaining an open fracture with tuft involvement in this finger 1 year ago during a crush injury.  He reports a constant pulling sensation in the distal finger with attempts to extend it and also has increasing pain which is worsened with the cold weather. He had underwent occupational therapy last year for this injury but he stopped going when he did not feel he was improving.  He denies new injury. Burning pain radiates into his upper arm when it gets bad, especially if he bumps the distal finger.       History reviewed. No pertinent past medical history. History reviewed. No pertinent past surgical history. No family history on file. History  Substance Use Topics  . Smoking status: Current Some Day Smoker  . Smokeless tobacco: Not on file  . Alcohol Use: Yes    Review of Systems  Constitutional: Negative for fever.  Musculoskeletal: Positive for arthralgias. Negative for myalgias and joint swelling.  Neurological: Negative for weakness and numbness.      Allergies  Review of patient's allergies indicates no known allergies.  Home Medications   Prior to Admission medications   Medication Sig Start Date End Date Taking? Authorizing Provider  acetaminophen (TYLENOL) 500 MG tablet Take 1,000 mg by mouth every 6 (six) hours as needed for pain.    Historical Provider, MD  cephALEXin (KEFLEX) 500 MG capsule Take 1 capsule (500 mg total) by mouth 4 (four) times daily. 03/17/13   Joya Gaskinsonald W Wickline, MD  oxyCODONE-acetaminophen (PERCOCET/ROXICET) 5-325 MG per tablet Take 1  tablet by mouth every 4 (four) hours as needed. 06/09/14   Burgess AmorJulie Dixie Jafri, PA-C   BP 140/95 mmHg  Pulse 82  Temp(Src) 98.1 F (36.7 C) (Oral)  Resp 16  Ht 5\' 6"  (1.676 m)  Wt 170 lb (77.111 kg)  BMI 27.45 kg/m2  SpO2 100% Physical Exam  Constitutional: He appears well-developed and well-nourished.  HENT:  Head: Atraumatic.  Neck: Normal range of motion.  Cardiovascular:  Pulses equal bilaterally  Musculoskeletal: He exhibits tenderness.       Right hand: He exhibits tenderness. He exhibits normal capillary refill. Normal sensation noted.       Hands: Neurological: He is alert. He has normal strength. He displays normal reflexes. No sensory deficit.  Skin: Skin is warm and dry.  Psychiatric: He has a normal mood and affect.    ED Course  Procedures (including critical care time) Labs Review Labs Reviewed - No data to display  Imaging Review No results found.   EKG Interpretation None      MDM   Final diagnoses:  Finger pain, right    Discussed various options, returning to occupational tx,  F/u with ortho.  Pt is desirous of seeing a hand specialist.  Referral given for Dr. Mina MarbleWeingold who is on call for us today.  Pt was given oxycodone script for pain relief qhs as pt states he has been having difficulty sleeping at night due to pain.  ?RSD syndrome.      Burgess AmorJulie Sharina Petre, PA-C 06/11/14 2215  Glynn OctaveStephen Rancour, MD 06/11/14 [phone]

## 2015-06-13 ENCOUNTER — Encounter (HOSPITAL_COMMUNITY): Payer: Self-pay | Admitting: Emergency Medicine

## 2015-06-13 ENCOUNTER — Emergency Department (HOSPITAL_COMMUNITY)
Admission: EM | Admit: 2015-06-13 | Discharge: 2015-06-13 | Disposition: A | Discharge status: Home / Self Care | Payer: Self-pay | Attending: Emergency Medicine | Admitting: Emergency Medicine

## 2015-06-13 DIAGNOSIS — Z792 Long term (current) use of antibiotics: Secondary | ICD-10-CM | POA: Insufficient documentation

## 2015-06-13 DIAGNOSIS — Z89021 Acquired absence of right finger(s): Secondary | ICD-10-CM | POA: Insufficient documentation

## 2015-06-13 DIAGNOSIS — I10 Essential (primary) hypertension: Secondary | ICD-10-CM | POA: Insufficient documentation

## 2015-06-13 DIAGNOSIS — G629 Polyneuropathy, unspecified: Secondary | ICD-10-CM | POA: Insufficient documentation

## 2015-06-13 DIAGNOSIS — F17210 Nicotine dependence, cigarettes, uncomplicated: Secondary | ICD-10-CM | POA: Insufficient documentation

## 2015-06-13 DIAGNOSIS — M792 Neuralgia and neuritis, unspecified: Secondary | ICD-10-CM

## 2015-06-13 DIAGNOSIS — G8929 Other chronic pain: Secondary | ICD-10-CM | POA: Insufficient documentation

## 2015-06-13 DIAGNOSIS — R200 Anesthesia of skin: Secondary | ICD-10-CM | POA: Insufficient documentation

## 2015-06-13 HISTORY — DX: Essential (primary) hypertension: I10

## 2015-06-13 MED ORDER — PREGABALIN 50 MG PO CAPS
100.0000 mg | ORAL_CAPSULE | Freq: Once | ORAL | Status: AC
  Administered 2015-06-13: 100 mg via ORAL
  Filled 2015-06-13: qty 2

## 2015-06-13 MED ORDER — HYDROCODONE-ACETAMINOPHEN 5-325 MG PO TABS: 1.0000 | ORAL_TABLET | Freq: Four times a day (QID) | ORAL | Status: DC | PRN

## 2015-06-13 MED ORDER — HYDROCODONE-ACETAMINOPHEN 5-325 MG PO TABS
1.0000 | ORAL_TABLET | Freq: Once | ORAL | Status: AC
  Administered 2015-06-13: 1 via ORAL
  Filled 2015-06-13: qty 1

## 2015-06-13 MED ORDER — PREGABALIN 100 MG PO CAPS: 100.0000 mg | ORAL_CAPSULE | Freq: Two times a day (BID) | ORAL | Status: DC

## 2015-06-13 NOTE — ED Notes (Signed)
Pt has right hand pinky swelling from damage several years ago. Pt has cap refill < 3 seconds.   In addition, pt has right shoulder pain that is shooting down his arm into his right pinky.

## 2015-06-13 NOTE — ED Notes (Addendum)
PT states old injury to right hand pinky finger in 2014. PT states cramping to right shoulder and radiating down right arm with burning to right hand worsening x3 weeks. PT states he has appointment on Monday with primary MD and states no HTN medications x3 days.

## 2015-06-13 NOTE — ED Notes (Signed)
MD at bedside. 

## 2015-06-13 NOTE — ED Provider Notes (Signed)
CSN: [id number]     Arrival date & time 06/13/15  7564 History   First MD Initiated Contact with Patient 06/13/15 0801     Chief Complaint  Patient presents with  . Hand Pain     (Consider location/radiation/quality/duration/timing/severity/associated sxs/prior Treatment) Patient is a 34 y.o. male presenting with hand pain.  Hand Pain This is a chronic problem. The current episode started more than 1 week ago. The problem occurs constantly. The problem has been gradually worsening. Pertinent negatives include no chest pain, no headaches and no shortness of breath. Nothing aggravates the symptoms. Nothing relieves the symptoms. He has tried nothing for the symptoms. The treatment provided no relief.    Past Medical History  Diagnosis Date  . Hypertension    History reviewed. No pertinent past surgical history. History reviewed. No pertinent family history. Social History  Substance Use Topics  . Smoking status: Current Some Day Smoker -- 0.50 packs/day    Types: Cigarettes  . Smokeless tobacco: None  . Alcohol Use: No    Review of Systems  Constitutional: Negative for fever and chills.  HENT: Negative for congestion and drooling.   Eyes: Negative for photophobia and pain.  Respiratory: Negative for shortness of breath.   Cardiovascular: Negative for chest pain.  Neurological: Negative for headaches.       Sharp, shooting, icy type pain from right fifth digit up medial side of arm to shoulder  All other systems reviewed and are negative.     Allergies  Review of patient's allergies indicates no known allergies.  Home Medications   Prior to Admission medications   Medication Sig Start Date End Date Taking? Authorizing Provider  acetaminophen (TYLENOL) 500 MG tablet Take 1,000 mg by mouth every 6 (six) hours as needed for pain.    Historical Provider, MD  cephALEXin (KEFLEX) 500 MG capsule Take 1 capsule (500 mg total) by mouth 4 (four) times daily. 03/17/13   Zadie Rhine, MD  HYDROcodone-acetaminophen (NORCO/VICODIN) 5-325 MG tablet Take 1 tablet by mouth every 6 (six) hours as needed for severe pain. 06/13/15   Marily Memos, MD  oxyCODONE-acetaminophen (PERCOCET/ROXICET) 5-325 MG per tablet Take 1 tablet by mouth every 4 (four) hours as needed. 06/09/14   Burgess Amor, PA-C  pregabalin (LYRICA) 100 MG capsule Take 1 capsule (100 mg total) by mouth 2 (two) times daily. 06/13/15   Marily Memos, MD   BP 146/99 mmHg  Pulse 90  Temp(Src) 98.1 F (36.7 C) (Oral)  Resp 18  Ht  (1.676 m)  Wt 175 lb (79.379 kg)  BMI 28.26 kg/m2  SpO2 100% Physical Exam  Constitutional: He is oriented to person, place, and time. He appears well-developed and well-nourished.  HENT:  Head: Normocephalic and atraumatic.  Neck: Normal range of motion.  Cardiovascular: Normal rate.   Pulmonary/Chest: Effort normal. No respiratory distress.  Abdominal: He exhibits no distension.  Musculoskeletal: Normal range of motion.  Neurological: He is alert and oriented to person, place, and time. No cranial nerve deficit. Coordination normal.  Numbness to distal right pinky, no other obvious neuro deficits  Nursing note and vitals reviewed.   ED Course  Procedures (including critical care time) Labs Review Labs Reviewed - No data to display  Imaging Review No results found. I have personally reviewed and evaluated these images and lab results as part of my medical decision-making.   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 Has FU w/ PCP on Monday. Doesn't feel gabapentin is helping much anymore, will switch to lyrica. Doubt cellulitis or cervical issues currently.      Marily MemosJason Ciria Bernardini, MD 06/13/15 [phone]

## 2015-06-17 DIAGNOSIS — Z1389 Encounter for screening for other disorder: Secondary | ICD-10-CM | POA: Diagnosis not present

## 2015-06-17 DIAGNOSIS — I10 Essential (primary) hypertension: Secondary | ICD-10-CM | POA: Diagnosis not present

## 2015-06-17 DIAGNOSIS — Z6827 Body mass index (BMI) 27.0-27.9, adult: Secondary | ICD-10-CM | POA: Diagnosis not present

## 2015-06-17 DIAGNOSIS — E663 Overweight: Secondary | ICD-10-CM | POA: Diagnosis not present

## 2015-06-17 MED FILL — LOSARTAN-HCTZ 100-25 MG TAB: 100-25 | 30 days supply | Qty: 30 | Fill #0

## 2015-06-17 MED FILL — LYRICA 100 MG CAPSULE: 100 | 30 days supply | Qty: 60 | Fill #0

## 2015-06-25 DIAGNOSIS — I10 Essential (primary) hypertension: Secondary | ICD-10-CM | POA: Diagnosis not present

## 2015-06-25 DIAGNOSIS — S6991XS Unspecified injury of right wrist, hand and finger(s), sequela: Secondary | ICD-10-CM | POA: Diagnosis not present

## 2015-06-25 DIAGNOSIS — F329 Major depressive disorder, single episode, unspecified: Secondary | ICD-10-CM | POA: Diagnosis not present

## 2015-06-25 DIAGNOSIS — Z6826 Body mass index (BMI) 26.0-26.9, adult: Secondary | ICD-10-CM | POA: Diagnosis not present

## 2015-06-25 MED FILL — ESCITALOPRAM 10 MG TABLET: 10 | 30 days supply | Qty: 30 | Fill #0

## 2015-06-25 MED FILL — GABAPENTIN 300 MG CAPSULE: 300 | 30 days supply | Qty: 90 | Fill #0

## 2015-06-25 MED FILL — LISINOPRIL 20 MG TABLET: 20 | 30 days supply | Qty: 30 | Fill #0

## 2015-07-09 DIAGNOSIS — L609 Nail disorder, unspecified: Secondary | ICD-10-CM | POA: Diagnosis not present

## 2015-07-09 DIAGNOSIS — M79644 Pain in right finger(s): Secondary | ICD-10-CM | POA: Diagnosis not present

## 2015-07-11 MED FILL — HYDROCODON-APAP 5-325: 5-325 | 4 days supply | Qty: 30 | Fill #0

## 2015-07-22 MED FILL — GABAPENTIN 300 MG CAPSULE: 300 | 30 days supply | Qty: 90 | Fill #1

## 2015-07-26 DIAGNOSIS — F329 Major depressive disorder, single episode, unspecified: Secondary | ICD-10-CM | POA: Diagnosis not present

## 2015-07-26 DIAGNOSIS — G629 Polyneuropathy, unspecified: Secondary | ICD-10-CM | POA: Diagnosis not present

## 2015-07-26 DIAGNOSIS — I10 Essential (primary) hypertension: Secondary | ICD-10-CM | POA: Diagnosis not present

## 2015-07-26 DIAGNOSIS — L609 Nail disorder, unspecified: Secondary | ICD-10-CM | POA: Diagnosis not present

## 2015-07-26 DIAGNOSIS — Z87891 Personal history of nicotine dependence: Secondary | ICD-10-CM | POA: Diagnosis not present

## 2015-07-26 DIAGNOSIS — Z833 Family history of diabetes mellitus: Secondary | ICD-10-CM | POA: Diagnosis not present

## 2015-07-26 DIAGNOSIS — Z79899 Other long term (current) drug therapy: Secondary | ICD-10-CM | POA: Diagnosis not present

## 2015-07-26 DIAGNOSIS — Z8249 Family history of ischemic heart disease and other diseases of the circulatory system: Secondary | ICD-10-CM | POA: Diagnosis not present

## 2015-07-26 DIAGNOSIS — M79644 Pain in right finger(s): Secondary | ICD-10-CM | POA: Diagnosis not present

## 2015-07-26 MED FILL — OXYCODONE/APAP 5-325: 5-325 | 3 days supply | Qty: 30 | Fill #0

## 2015-08-02 MED FILL — HYDROCODON-APAP 5-325: 5-325 | 7 days supply | Qty: 21 | Fill #0

## 2015-08-08 DIAGNOSIS — I10 Essential (primary) hypertension: Secondary | ICD-10-CM | POA: Diagnosis not present

## 2015-08-08 DIAGNOSIS — Z0000 Encounter for general adult medical examination without abnormal findings: Secondary | ICD-10-CM | POA: Diagnosis not present

## 2015-08-08 DIAGNOSIS — Z6828 Body mass index (BMI) 28.0-28.9, adult: Secondary | ICD-10-CM | POA: Diagnosis not present

## 2015-08-08 DIAGNOSIS — E663 Overweight: Secondary | ICD-10-CM | POA: Diagnosis not present

## 2015-08-08 DIAGNOSIS — Z1389 Encounter for screening for other disorder: Secondary | ICD-10-CM | POA: Diagnosis not present

## 2015-08-08 MED FILL — HYDROCODON-APAP 5-325: 5-325 | 7 days supply | Qty: 21 | Fill #0

## 2015-08-09 MED FILL — DIPHENOXYLATE-ATROPINE TAB: 2.5-0.025 | 5 days supply | Qty: 30 | Fill #0

## 2015-08-09 MED FILL — ONDANSETRON HCL 4 MG TABLET: 4 | 8 days supply | Qty: 30 | Fill #0

## 2015-08-14 DIAGNOSIS — L609 Nail disorder, unspecified: Secondary | ICD-10-CM | POA: Diagnosis not present

## 2015-08-14 DIAGNOSIS — S68129D Partial traumatic metacarpophalangeal amputation of unspecified finger, subsequent encounter: Secondary | ICD-10-CM | POA: Diagnosis not present

## 2015-08-14 MED FILL — PRAVASTATIN SODIUM 20 MG TA: 20 | 90 days supply | Qty: 90 | Fill #0

## 2015-08-14 MED FILL — LISINOPRIL 20 MG TABLET: 20 | 90 days supply | Qty: 90 | Fill #1

## 2015-08-19 MED FILL — GABAPENTIN 300 MG CAPSULE: 300 | 30 days supply | Qty: 90 | Fill #2

## 2015-08-30 DIAGNOSIS — Z1389 Encounter for screening for other disorder: Secondary | ICD-10-CM | POA: Diagnosis not present

## 2015-08-30 DIAGNOSIS — Z6827 Body mass index (BMI) 27.0-27.9, adult: Secondary | ICD-10-CM | POA: Diagnosis not present

## 2015-08-30 DIAGNOSIS — L308 Other specified dermatitis: Secondary | ICD-10-CM | POA: Diagnosis not present

## 2015-08-30 DIAGNOSIS — E663 Overweight: Secondary | ICD-10-CM | POA: Diagnosis not present

## 2015-08-30 DIAGNOSIS — L309 Dermatitis, unspecified: Secondary | ICD-10-CM | POA: Diagnosis not present

## 2015-09-25 DIAGNOSIS — S68129D Partial traumatic metacarpophalangeal amputation of unspecified finger, subsequent encounter: Secondary | ICD-10-CM | POA: Diagnosis not present

## 2015-09-25 DIAGNOSIS — L609 Nail disorder, unspecified: Secondary | ICD-10-CM | POA: Diagnosis not present

## 2015-12-10 MED FILL — PRAVASTATIN SODIUM 20 MG TA: 20 | 90 days supply | Qty: 90 | Fill #1

## 2015-12-11 MED FILL — LISINOPRIL 20 MG TABLET: 20 | 90 days supply | Qty: 90 | Fill #0

## 2015-12-16 ENCOUNTER — Emergency Department (HOSPITAL_COMMUNITY): Payer: Worker's Compensation

## 2015-12-16 ENCOUNTER — Encounter (HOSPITAL_COMMUNITY): Payer: Self-pay | Admitting: Emergency Medicine

## 2015-12-16 ENCOUNTER — Emergency Department (HOSPITAL_COMMUNITY)
Admission: EM | Admit: 2015-12-16 | Discharge: 2015-12-16 | Disposition: A | Discharge status: Home / Self Care | Payer: Worker's Compensation | Attending: Emergency Medicine | Admitting: Emergency Medicine

## 2015-12-16 DIAGNOSIS — Y999 Unspecified external cause status: Secondary | ICD-10-CM | POA: Insufficient documentation

## 2015-12-16 DIAGNOSIS — X58XXXA Exposure to other specified factors, initial encounter: Secondary | ICD-10-CM | POA: Insufficient documentation

## 2015-12-16 DIAGNOSIS — Y929 Unspecified place or not applicable: Secondary | ICD-10-CM | POA: Insufficient documentation

## 2015-12-16 DIAGNOSIS — T63001A Toxic effect of unspecified snake venom, accidental (unintentional), initial encounter: Secondary | ICD-10-CM | POA: Insufficient documentation

## 2015-12-16 DIAGNOSIS — I10 Essential (primary) hypertension: Secondary | ICD-10-CM | POA: Insufficient documentation

## 2015-12-16 DIAGNOSIS — Y939 Activity, unspecified: Secondary | ICD-10-CM | POA: Insufficient documentation

## 2015-12-16 DIAGNOSIS — F17210 Nicotine dependence, cigarettes, uncomplicated: Secondary | ICD-10-CM | POA: Insufficient documentation

## 2015-12-16 DIAGNOSIS — Z79899 Other long term (current) drug therapy: Secondary | ICD-10-CM | POA: Insufficient documentation

## 2015-12-16 LAB — PROTIME-INR
INR: 0.96 (ref 0.00–1.49)
PROTHROMBIN TIME: 13 s (ref 11.6–15.2)

## 2015-12-16 LAB — COMPREHENSIVE METABOLIC PANEL
ALK PHOS: 74 U/L (ref 38–126)
ALT: 19 U/L (ref 17–63)
ANION GAP: 4 — AB (ref 5–15)
AST: 19 U/L (ref 15–41)
Albumin: 3.8 g/dL (ref 3.5–5.0)
BILIRUBIN TOTAL: 0.3 mg/dL (ref 0.3–1.2)
BUN: 16 mg/dL (ref 6–20)
CO2: 23 mmol/L (ref 22–32)
Calcium: 8.7 mg/dL — ABNORMAL LOW (ref 8.9–10.3)
Chloride: 111 mmol/L (ref 101–111)
Creatinine, Ser: 0.78 mg/dL (ref 0.61–1.24)
Glucose, Bld: 92 mg/dL (ref 65–99)
Potassium: 4.2 mmol/L (ref 3.5–5.1)
Sodium: 138 mmol/L (ref 135–145)
TOTAL PROTEIN: 5.9 g/dL — AB (ref 6.5–8.1)

## 2015-12-16 LAB — CBC WITH DIFFERENTIAL/PLATELET
Basophils Absolute: 0 10*3/uL (ref 0.0–0.1)
Basophils Relative: 1 %
EOS PCT: 5 %
Eosinophils Absolute: 0.3 10*3/uL (ref 0.0–0.7)
HEMATOCRIT: 40.7 % (ref 39.0–52.0)
Hemoglobin: 13.3 g/dL (ref 13.0–17.0)
LYMPHS PCT: 34 %
Lymphs Abs: 1.8 10*3/uL (ref 0.7–4.0)
MCH: 31.1 pg (ref 26.0–34.0)
MCHC: 32.7 g/dL (ref 30.0–36.0)
MCV: 95.1 fL (ref 78.0–100.0)
MONO ABS: 0.3 10*3/uL (ref 0.1–1.0)
MONOS PCT: 6 %
NEUTROS ABS: 2.9 10*3/uL (ref 1.7–7.7)
Neutrophils Relative %: 54 %
Platelets: 184 10*3/uL (ref 150–400)
RBC: 4.28 MIL/uL (ref 4.22–5.81)
RDW: 12.9 % (ref 11.5–15.5)
WBC: 5.4 10*3/uL (ref 4.0–10.5)

## 2015-12-16 LAB — CK: CK TOTAL: 177 U/L (ref 49–397)

## 2015-12-16 LAB — URINALYSIS, ROUTINE W REFLEX MICROSCOPIC
Bilirubin Urine: NEGATIVE
GLUCOSE, UA: NEGATIVE mg/dL
HGB URINE DIPSTICK: NEGATIVE
Ketones, ur: NEGATIVE mg/dL
Leukocytes, UA: NEGATIVE
Nitrite: NEGATIVE
PROTEIN: NEGATIVE mg/dL
Specific Gravity, Urine: 1.011 (ref 1.005–1.030)
pH: 6 (ref 5.0–8.0)

## 2015-12-16 LAB — APTT: aPTT: 27 seconds (ref 24–37)

## 2015-12-16 MED ORDER — HYDROCODONE-ACETAMINOPHEN 5-325 MG PO TABS: 2.0000 | ORAL_TABLET | ORAL | Status: DC | PRN

## 2015-12-16 MED ORDER — KETOROLAC TROMETHAMINE 60 MG/2ML IM SOLN: 60.0000 mg | Freq: Once | INTRAMUSCULAR | Status: DC

## 2015-12-16 MED ORDER — OXYCODONE-ACETAMINOPHEN 5-325 MG PO TABS
1.0000 | ORAL_TABLET | Freq: Once | ORAL | Status: AC
  Administered 2015-12-16: 1 via ORAL
  Filled 2015-12-16: qty 1

## 2015-12-16 MED ORDER — HYDROMORPHONE HCL 1 MG/ML IJ SOLN
0.5000 mg | Freq: Once | INTRAMUSCULAR | Status: AC
  Administered 2015-12-16: 0.5 mg via INTRAMUSCULAR
  Filled 2015-12-16: qty 1

## 2015-12-16 MED FILL — HYDROCODON-APAP 5-325: 5-325 | 1 days supply | Qty: 6 | Fill #0

## 2015-12-16 NOTE — ED Notes (Signed)
Pt. Took his ring off on left ring finger and put in his pocket due to swelling

## 2015-12-16 NOTE — ED Notes (Signed)
Pt. Stated, i was bit by a snake bite, i was pruning and it was a brown little snake.  My arm is burning all the way up. Bite is on the left little finger.

## 2015-12-16 NOTE — ED Provider Notes (Signed)
CSN: [id number]     Arrival date & time 12/16/15  1017 History   First MD Initiated Contact with Patient 12/16/15 1023     Chief Complaint  Patient presents with  . Snake Bite    HPI Comments: 35 year old male presents with a snake bite. Past medical history of hypertension and hyperlipidemia. Patient works as a Administratorlandscaper and states he was picking up brush and trash when small foot-long brown snake bit him on the left pinky finger. The snake got away so they were unable to identify it. He had an immediate onset of pain and swelling. He states it feels like burning and radiates up to his left shoulder. Denies dizziness, weakness, chest pain, shortness of breath.   Past Medical History  Diagnosis Date  . Hypertension    History reviewed. No pertinent past surgical history. No family history on file. Social History  Substance Use Topics  . Smoking status: Current Some Day Smoker -- 0.50 packs/day    Types: Cigarettes  . Smokeless tobacco: None  . Alcohol Use: No    Review of Systems  Musculoskeletal: Positive for joint swelling and arthralgias.  Neurological: Negative for weakness and numbness.  All other systems reviewed and are negative.     Allergies  Review of patient's allergies indicates no known allergies.  Home Medications   Prior to Admission medications   Medication Sig Start Date End Date Taking? Authorizing Provider  acetaminophen (TYLENOL) 500 MG tablet Take 1,000 mg by mouth every 6 (six) hours as needed for pain.   Yes Historical Provider, MD  diphenhydrAMINE (BENADRYL) 25 mg capsule Take 100 mg by mouth at bedtime.   Yes Historical Provider, MD  ibuprofen (ADVIL,MOTRIN) 200 MG tablet Take 200 mg by mouth every 6 (six) hours as needed for moderate pain.   Yes Historical Provider, MD  lisinopril (PRINIVIL,ZESTRIL) 20 MG tablet Take 20 mg by mouth daily. 12/11/15  Yes Historical Provider, MD  pravastatin (PRAVACHOL) 20 MG tablet Take 20 mg by mouth daily. 12/10/15   Yes Historical Provider, MD  simvastatin (ZOCOR) 20 MG tablet Take 20 mg by mouth daily.   Yes Historical Provider, MD  cephALEXin (KEFLEX) 500 MG capsule Take 1 capsule (500 mg total) by mouth 4 (four) times daily. Patient not taking: Reported on 12/16/2015 03/17/13   Zadie Rhineonald Wickline, MD  HYDROcodone-acetaminophen (NORCO/VICODIN) 5-325 MG tablet Take 1 tablet by mouth every 6 (six) hours as needed for severe pain. Patient not taking: Reported on 12/16/2015 06/13/15   Marily MemosJason Mesner, MD  oxyCODONE-acetaminophen (PERCOCET/ROXICET) 5-325 MG per tablet Take 1 tablet by mouth every 4 (four) hours as needed. Patient not taking: Reported on 12/16/2015 06/09/14   Burgess AmorJulie Idol, PA-C  pregabalin (LYRICA) 100 MG capsule Take 1 capsule (100 mg total) by mouth 2 (two) times daily. Patient not taking: Reported on 12/16/2015 06/13/15   Marily MemosJason Mesner, MD   BP 145/108 mmHg  Pulse 74  Temp(Src) 98.1 F (36.7 C) (Oral)  Resp 18  Ht 5\' 4"  (1.626 m)  Wt 81.647 kg  BMI 30.88 kg/m2  SpO2 97%   Physical Exam  Constitutional: He is oriented to person, place, and time. He appears well-developed and well-nourished. No distress.  HENT:  Head: Normocephalic and atraumatic.  Eyes: Conjunctivae are normal. Pupils are equal, round, and reactive to light. Right eye exhibits no discharge. Left eye exhibits no discharge. No scleral icterus.  Neck: Normal range of motion.  Cardiovascular: Normal rate and regular rhythm.  Exam reveals no gallop and  no friction rub.   No murmur heard. Pulmonary/Chest: Effort normal and breath sounds normal. No respiratory distress. He has no wheezes. He has no rales. He exhibits no tenderness.  Abdominal: Soft. Bowel sounds are normal. He exhibits no distension. There is no tenderness.  Musculoskeletal:  Left hand: Mild swelling of pink and ring finger. Pinpoint bite mark seen on lateral pinky finger. Moderate tenderness to palpation. FROM. N/V intact.    Neurological: He is alert and oriented to  person, place, and time.  Skin: Skin is warm and dry.  Psychiatric: He has a normal mood and affect.    ED Course  Procedures (including critical care time) Labs Review Labs Reviewed  COMPREHENSIVE METABOLIC PANEL - Abnormal; Notable for the following:    Calcium 8.7 (*)    Total Protein 5.9 (*)    Anion gap 4 (*)    All other components within normal limits  CBC WITH DIFFERENTIAL/PLATELET  PROTIME-INR  CK  APTT  URINALYSIS, ROUTINE W REFLEX MICROSCOPIC (NOT AT [facility])    Imaging Review Dg Hand Complete Left  12/16/2015  CLINICAL DATA:  Snake bite fifth finger EXAM: LEFT HAND - COMPLETE 3+ VIEW COMPARISON:  None. FINDINGS: Three views of the left hand submitted. No acute fracture or subluxation. No radiopaque foreign body. IMPRESSION: Negative. Electronically Signed   By: Natasha MeadLiviu  Pop M.D.   On: 12/16/2015 11:59   I have personally reviewed and evaluated these images and lab results as part of my medical decision-making.   EKG Interpretation None      MDM   Final diagnoses:  Snake bite, accidental or unintentional, initial encounter   35 year old male who presents after a snake bite. Poison control contacted who recommended observation for 6 hours. Patient was observed for full 6 hours without complications or development of new symptoms. Per poison control recommendations: No ice, tourniquets, sucking the wound. Soap and water to clean wound and no steroids or antibiotics. Patient is hypertensive - has hx of HTN. All other vitals are WNL and stable. Xray negative for acute pathology. Labs are all unremarkable. Patient given pain medicine with good relief. Will d/c with rx for pain medicine and return precautions. Shared visit with Dr. Clarene DukeLittle. Patient is NAD, non-toxic, with stable VS. Patient is informed of clinical course, understands medical decision making process, and agrees with plan. Opportunity for questions provided and all questions answered. Return precautions  given.   Spoke with poison control - must watch patient for at least 6 hours.  Bethel BornKelly Marie Gabryel Talamo, PA-C 12/16/15 1712  Laurence Spatesachel Morgan Little, MD 12/17/15 [phone]

## 2015-12-16 NOTE — ED Notes (Signed)
Pt transported to xray 

## 2016-01-02 DIAGNOSIS — Z1389 Encounter for screening for other disorder: Secondary | ICD-10-CM | POA: Diagnosis not present

## 2016-01-02 DIAGNOSIS — Z6827 Body mass index (BMI) 27.0-27.9, adult: Secondary | ICD-10-CM | POA: Diagnosis not present

## 2016-01-02 DIAGNOSIS — M545 Low back pain: Secondary | ICD-10-CM | POA: Diagnosis not present

## 2016-01-03 MED FILL — IBUPROFEN 800 MG TABLET: 800 | 10 days supply | Qty: 30 | Fill #0

## 2016-01-28 ENCOUNTER — Emergency Department (HOSPITAL_COMMUNITY)
Admission: EM | Admit: 2016-01-28 | Discharge: 2016-01-28 | Disposition: A | Discharge status: Other Acute Inpatient Hospital | Payer: 59 | Attending: Emergency Medicine | Admitting: Emergency Medicine

## 2016-01-28 ENCOUNTER — Encounter (HOSPITAL_COMMUNITY): Payer: Self-pay

## 2016-01-28 ENCOUNTER — Observation Stay (HOSPITAL_COMMUNITY)
Admission: AD | Admit: 2016-01-28 | Discharge: 2016-01-29 | Disposition: A | Discharge status: Rehabilitation Facility | Payer: 59 | Source: Intra-hospital | Attending: Psychiatry | Admitting: Psychiatry

## 2016-01-28 ENCOUNTER — Encounter (HOSPITAL_COMMUNITY): Payer: Self-pay | Admitting: Emergency Medicine

## 2016-01-28 DIAGNOSIS — Z89021 Acquired absence of right finger(s): Secondary | ICD-10-CM | POA: Insufficient documentation

## 2016-01-28 DIAGNOSIS — Z79899 Other long term (current) drug therapy: Secondary | ICD-10-CM | POA: Diagnosis not present

## 2016-01-28 DIAGNOSIS — F332 Major depressive disorder, recurrent severe without psychotic features: Secondary | ICD-10-CM | POA: Insufficient documentation

## 2016-01-28 DIAGNOSIS — F17210 Nicotine dependence, cigarettes, uncomplicated: Secondary | ICD-10-CM | POA: Diagnosis not present

## 2016-01-28 DIAGNOSIS — F411 Generalized anxiety disorder: Secondary | ICD-10-CM | POA: Diagnosis not present

## 2016-01-28 DIAGNOSIS — R45851 Suicidal ideations: Secondary | ICD-10-CM | POA: Diagnosis not present

## 2016-01-28 DIAGNOSIS — R4182 Altered mental status, unspecified: Secondary | ICD-10-CM | POA: Diagnosis present

## 2016-01-28 DIAGNOSIS — R4589 Other symptoms and signs involving emotional state: Secondary | ICD-10-CM

## 2016-01-28 DIAGNOSIS — I10 Essential (primary) hypertension: Secondary | ICD-10-CM | POA: Insufficient documentation

## 2016-01-28 DIAGNOSIS — R4689 Other symptoms and signs involving appearance and behavior: Secondary | ICD-10-CM

## 2016-01-28 DIAGNOSIS — F1424 Cocaine dependence with cocaine-induced mood disorder: Principal | ICD-10-CM | POA: Diagnosis present

## 2016-01-28 DIAGNOSIS — F3289 Other specified depressive episodes: Secondary | ICD-10-CM

## 2016-01-28 LAB — ACETAMINOPHEN LEVEL: Acetaminophen (Tylenol), Serum: <10 ug/mL — ABNORMAL LOW (ref 10–30)

## 2016-01-28 LAB — COMPREHENSIVE METABOLIC PANEL
ALT: 27 U/L (ref 17–63)
AST: 27 U/L (ref 15–41)
Albumin: 5 g/dL (ref 3.5–5.0)
Alkaline Phosphatase: 110 U/L (ref 38–126)
Anion gap: 6 (ref 5–15)
BUN: 20 mg/dL (ref 6–20)
CHLORIDE: 103 mmol/L (ref 101–111)
CO2: 28 mmol/L (ref 22–32)
Calcium: 9.8 mg/dL (ref 8.9–10.3)
Creatinine, Ser: 0.98 mg/dL (ref 0.61–1.24)
GFR calc Af Amer: >60 mL/min (ref >60)
Glucose, Bld: 115 mg/dL — ABNORMAL HIGH (ref 65–99)
POTASSIUM: 3.7 mmol/L (ref 3.5–5.1)
Sodium: 137 mmol/L (ref 135–145)
Total Bilirubin: 0.8 mg/dL (ref 0.3–1.2)
Total Protein: 7.8 g/dL (ref 6.5–8.1)

## 2016-01-28 LAB — CBC
HCT: 49.2 % (ref 39.0–52.0)
Hemoglobin: 17 g/dL (ref 13.0–17.0)
MCH: 31.8 pg (ref 26.0–34.0)
MCHC: 34.6 g/dL (ref 30.0–36.0)
MCV: 92.1 fL (ref 78.0–100.0)
PLATELETS: 221 10*3/uL (ref 150–400)
RBC: 5.34 MIL/uL (ref 4.22–5.81)
RDW: 13.8 % (ref 11.5–15.5)
WBC: 9 10*3/uL (ref 4.0–10.5)

## 2016-01-28 LAB — RAPID URINE DRUG SCREEN, HOSP PERFORMED
AMPHETAMINES: NOT DETECTED
BENZODIAZEPINES: NOT DETECTED
Barbiturates: NOT DETECTED
COCAINE: POSITIVE — AB
OPIATES: NOT DETECTED
Tetrahydrocannabinol: NOT DETECTED

## 2016-01-28 LAB — SALICYLATE LEVEL

## 2016-01-28 LAB — ETHANOL

## 2016-01-28 MED ORDER — HYDROXYZINE HCL 25 MG PO TABS: 25.0000 mg | ORAL_TABLET | Freq: Four times a day (QID) | ORAL | Status: DC | PRN

## 2016-01-28 MED ORDER — LORAZEPAM 1 MG PO TABS
1.0000 mg | ORAL_TABLET | Freq: Once | ORAL | Status: AC
  Administered 2016-01-28: 1 mg via ORAL
  Filled 2016-01-28: qty 1

## 2016-01-28 MED ORDER — IBUPROFEN 800 MG PO TABS: 800.0000 mg | ORAL_TABLET | Freq: Three times a day (TID) | ORAL | Status: DC | PRN

## 2016-01-28 MED ORDER — TRAZODONE HCL 50 MG PO TABS: 50.0000 mg | ORAL_TABLET | Freq: Every evening | ORAL | Status: DC | PRN

## 2016-01-28 MED ORDER — GABAPENTIN 400 MG PO CAPS
400.0000 mg | ORAL_CAPSULE | Freq: Three times a day (TID) | ORAL | Status: DC
  Administered 2016-01-28 – 2016-01-29 (×3): 400 mg via ORAL
  Filled 2016-01-28 (×3): qty 1

## 2016-01-28 MED ORDER — DULOXETINE HCL 20 MG PO CPEP
20.0000 mg | ORAL_CAPSULE | Freq: Two times a day (BID) | ORAL | Status: DC
  Administered 2016-01-28 – 2016-01-29 (×2): 20 mg via ORAL
  Filled 2016-01-28 (×2): qty 1

## 2016-01-28 MED ORDER — ACETAMINOPHEN 325 MG PO TABS: 650.0000 mg | ORAL_TABLET | Freq: Four times a day (QID) | ORAL | Status: DC | PRN

## 2016-01-28 MED ORDER — MAGNESIUM HYDROXIDE 400 MG/5ML PO SUSP: 30.0000 mL | Freq: Every day | ORAL | Status: DC | PRN

## 2016-01-28 MED ORDER — ACETAMINOPHEN 325 MG PO TABS
650.0000 mg | ORAL_TABLET | Freq: Once | ORAL | Status: AC
  Administered 2016-01-28: 650 mg via ORAL
  Filled 2016-01-28: qty 2

## 2016-01-28 MED ORDER — PRAVASTATIN SODIUM 20 MG PO TABS
20.0000 mg | ORAL_TABLET | Freq: Every day | ORAL | Status: DC
  Administered 2016-01-29: 20 mg via ORAL
  Filled 2016-01-28 (×4): qty 1

## 2016-01-28 MED ORDER — LISINOPRIL 20 MG PO TABS
20.0000 mg | ORAL_TABLET | Freq: Every day | ORAL | Status: DC
  Administered 2016-01-28 – 2016-01-29 (×2): 20 mg via ORAL
  Filled 2016-01-28 (×2): qty 1

## 2016-01-28 MED ORDER — ALUM & MAG HYDROXIDE-SIMETH 200-200-20 MG/5ML PO SUSP: 30.0000 mL | ORAL | Status: DC | PRN

## 2016-01-28 NOTE — BH Assessment (Addendum)
Tele Assessment Note   Celene SkeenBenito Ziebell is a 35 y.o. male who presents voluntarily to [facility], accompanied by his wife, Geronimo Bootbby Ouzts, to request help for his cocaine use. Pt has no psychiatric hx and has no substance abuse rehab hx. Pt endorses SI with a passive plan to hang himself. Pt states he has been having these feelings "when I see my family and the harm I'm causing them". Pt denies actual suicidal intent. Pt denies HI, AVH.   Diagnosis: Cocaine induced depressive disorder, with severe use  Past Medical History:  Past Medical History:  Diagnosis Date  . Hypertension     History reviewed. No pertinent surgical history.  Family History: No family history on file.  Social History:  reports that he has been smoking Cigarettes.  He has been smoking about 0.50 packs per day. He has never used smokeless tobacco. He reports that he uses drugs, including Cocaine. He reports that he does not drink alcohol.  Additional Social History:  Alcohol / Drug Use Pain Medications: see PTA meds Prescriptions: see PTA meds Over the Counter: see PTA meds History of alcohol / drug use?: Yes Substance #1 Name of Substance 1: Cocaine 1 - Age of First Use: 23 1 - Amount (size/oz): varies 1 - Frequency: daily 1 - Duration: ongoing for the last 3 months 1 - Last Use / Amount: 2 days ago  CIWA: CIWA-Ar BP: (!) 160/104 Pulse Rate: 110 COWS:    PATIENT STRENGTHS: (choose at least two) Average or above average intelligence Capable of independent living Motivation for treatment/growth Supportive family/friends  Allergies: No Known Allergies  Home Medications:  (Not in a hospital admission)  OB/GYN Status:  No LMP for male patient.  General Assessment Data Location of Assessment: [facility] TTS Assessment: In system Is this a Tele or Face-to-Face Assessment?: Tele Assessment Is this an Initial Assessment or a Re-assessment for this encounter?: Initial Assessment Marital status: Married Living  Arrangements: Spouse/significant other, Children Can pt return to current living arrangement?: Yes Admission Status: Voluntary Is patient capable of signing voluntary admission?: Yes Referral Source: Self/Family/Friend Insurance type: [employer] employee     Crisis Care Plan Living Arrangements: Spouse/significant other, Children Name of Psychiatrist: none Name of Therapist: none  Education Status Is patient currently in school?: No  Risk to self with the past 6 months Suicidal Ideation: Yes-Currently Present Has patient been a risk to self within the past 6 months prior to admission? : No Suicidal Intent: No Has patient had any suicidal intent within the past 6 months prior to admission? : No Is patient at risk for suicide?: No Suicidal Plan?: Yes-Currently Present Has patient had any suicidal plan within the past 6 months prior to admission? : No Specify Current Suicidal Plan: hanging himself at home Access to Means: Yes Specify Access to Suicidal Means: household item What has been your use of drugs/alcohol within the last 12 months?: see above Previous Attempts/Gestures: No Intentional Self Injurious Behavior: None Family Suicide History: Unknown Recent stressful life event(s): Other (Comment) (drug abuse) Persecutory voices/beliefs?: No Depression: Yes Depression Symptoms: Insomnia, Feeling worthless/self pity Substance abuse history and/or treatment for substance abuse?: No Suicide prevention information given to non-admitted patients: Not applicable  Risk to Others within the past 6 months Homicidal Ideation: No Does patient have any lifetime risk of violence toward others beyond the six months prior to admission? : No Thoughts of Harm to Others: No Current Homicidal Intent: No Current Homicidal Plan: No Access to Homicidal Means: No  History of harm to others?: No Assessment of Violence: None Noted Does patient have access to weapons?: No Criminal Charges Pending?:  No Does patient have a court date: No Is patient on probation?: No  Psychosis Hallucinations: None noted Delusions: None noted  Mental Status Report Appearance/Hygiene: Unremarkable Eye Contact: Good Motor Activity: Unremarkable Speech: Logical/coherent Level of Consciousness: Quiet/awake, Alert Mood: Sad, Ashamed/humiliated Affect: Appropriate to circumstance Anxiety Level: None Thought Processes: Coherent, Relevant Judgement: Partial Orientation: Person, Place, Time, Situation, Appropriate for developmental age Obsessive Compulsive Thoughts/Behaviors: None  Cognitive Functioning Concentration: Normal Memory: Recent Intact, Remote Intact IQ: Average Insight: see judgement above Impulse Control: Unable to Assess Appetite: Good Sleep: No Change Vegetative Symptoms: None  ADLScreening [facility]) Patient's cognitive ability adequate to safely complete daily activities?: Yes Patient able to express need for assistance with ADLs?: Yes Independently performs ADLs?: Yes (appropriate for developmental age)  Prior Inpatient Therapy Prior Inpatient Therapy: No  Prior Outpatient Therapy Prior Outpatient Therapy: No Does patient have an ACCT team?: No Does patient have Intensive In-House Services?  : No Does patient have [facility] services? : No Does patient have P4CC services?: No  ADL Screening (condition at time of admission) Patient's cognitive ability adequate to safely complete daily activities?: Yes Is the patient deaf or have difficulty hearing?: No Does the patient have difficulty seeing, even when wearing glasses/contacts?: No Does the patient have difficulty concentrating, remembering, or making decisions?: No Patient able to express need for assistance with ADLs?: Yes Does the patient have difficulty dressing or bathing?: No Independently performs ADLs?: Yes (appropriate for developmental age) Does the patient have difficulty walking or climbing  stairs?: No Weakness of Legs: None Weakness of Arms/Hands: None  Home Assistive Devices/Equipment Home Assistive Devices/Equipment: None  Therapy Consults (therapy consults require a physician order) PT Evaluation Needed: No OT Evalulation Needed: No SLP Evaluation Needed: No Abuse/Neglect Assessment (Assessment to be complete while patient is alone) Physical Abuse: Denies Verbal Abuse: Denies Sexual Abuse: Denies Exploitation of patient/patient's resources: Denies Self-Neglect: Denies Values / Beliefs Cultural Requests During Hospitalization: None Spiritual Requests During Hospitalization: None Consults Spiritual Care Consult Needed: No Social Work Consult Needed: No Merchant navy officerAdvance Directives (For Healthcare) Does patient have an advance directive?: No Would patient like information on creating an advanced directive?: No - patient declined information    Additional Information 1:1 In Past 12 Months?: No CIRT Risk: No Elopement Risk: No Does patient have medical clearance?: Yes     Disposition:  Disposition Initial Assessment Completed for this Encounter: Yes (consulted with Fransisca KaufmannLaura Davis, NP) Disposition of Patient: Outpatient treatment [facility] Type of outpatient treatment: Adult  Laddie AquasSamantha M Tremon Sainvil 01/28/2016 2:23 PM

## 2016-01-28 NOTE — ED Provider Notes (Signed)
[facility] Provider Note   CSN: [id number] Arrival date & time: 01/28/16  1206   By signing my name below, I, Christel MormonMatthew Jamison, attest that this documentation has been prepared under the direction and in the presence of Bethann BerkshireJoseph Alonnah Lampkins, MD . Electronically Signed: Christel MormonMatthew Jamison, Scribe. 01/28/2016. 12:24 PM.   History   Chief Complaint Chief Complaint  Patient presents with  . Addiction Problem     The history is provided by the patient. No language interpreter was used.  Altered Mental Status   The current episode started 2 days ago. The problem has not changed since onset.Pertinent negatives include no seizures and no hallucinations. Risk factors include illicit drug use. His past medical history is significant for hypertension.   HPI Comments:  Jason Rich is a 35 y.o. male who presents to the Emergency Department for help with cocaine abuse. Pt has associated SI with no plan. Pt denies and past Hx of suicide attempt. Pt has Hx of hypertension and has been compliant with his medication and also takes medicine for HLD Pt has no acute physical complaints or symptoms at this time.   Past Medical History:  Diagnosis Date  . Hypertension     Patient Active Problem List   Diagnosis Date Noted  . 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 Fracture of finger, distal phalanx, right, open 05/30/2013  . Pain in joint, hand 05/30/2013    History reviewed. No pertinent surgical history.     Home Medications    Prior to Admission medications   Medication Sig Start Date End Date Taking? Authorizing Provider  acetaminophen (TYLENOL) 500 MG tablet Take 1,000 mg by mouth every 6 (six) hours as needed for pain.    Historical Provider, MD  diphenhydrAMINE (BENADRYL) 25 mg capsule Take 100 mg by mouth at bedtime.    Historical Provider, MD  HYDROcodone-acetaminophen (NORCO/VICODIN) 5-325 MG tablet Take 2 tablets by mouth every 4 (four) hours as needed. 12/16/15    Bethel BornKelly Marie Gekas, PA-C  ibuprofen (ADVIL,MOTRIN) 200 MG tablet Take 200 mg by mouth every 6 (six) hours as needed for moderate pain.    Historical Provider, MD  lisinopril (PRINIVIL,ZESTRIL) 20 MG tablet Take 20 mg by mouth daily. 12/11/15   Historical Provider, MD  pravastatin (PRAVACHOL) 20 MG tablet Take 20 mg by mouth daily. 12/10/15   Historical Provider, MD  simvastatin (ZOCOR) 20 MG tablet Take 20 mg by mouth daily.    Historical Provider, MD    Family History No family history on file.  Social History Social History  Substance Use Topics  . Smoking status: Current Some Day Smoker    Packs/day: 0.50    Types: Cigarettes  . Smokeless tobacco: Never Used  . Alcohol use No     Allergies   Review of patient's allergies indicates no known allergies.   Review of Systems Review of Systems  Constitutional: Negative for appetite change and fatigue.  HENT: Negative for congestion, ear discharge and sinus pressure.   Eyes: Negative for discharge.  Respiratory: Negative for cough.   Cardiovascular: Negative for chest pain.  Gastrointestinal: Negative for abdominal pain and diarrhea.  Genitourinary: Negative for frequency and hematuria.  Musculoskeletal: Positive for arthralgias (Chronic right pinky pain). Negative for back pain.  Skin: Negative for rash.  Neurological: Negative for seizures and headaches.  Psychiatric/Behavioral: Positive for suicidal ideas. Negative for hallucinations.     Physical Exam Updated Vital Signs BP (!) 160/104   Pulse 110  Temp 98.5 F (36.9 C) (Oral)   Resp 20   Ht 5\' 5"  (1.651 m)   Wt 172 lb (78 kg)   SpO2 100%   BMI 28.62 kg/m   Physical Exam  Constitutional: He is oriented to person, place, and time. He appears well-developed.  HENT:  Head: Normocephalic.  Eyes: Conjunctivae and EOM are normal. No scleral icterus.  Neck: Neck supple. No thyromegaly present.  Cardiovascular: Normal rate and regular rhythm.  Exam reveals no gallop  and no friction rub.   No murmur heard. Pulmonary/Chest: No stridor. He has no wheezes. He has no rales. He exhibits no tenderness.  Abdominal: He exhibits no distension. There is no tenderness. There is no rebound.  Musculoskeletal: Normal range of motion. He exhibits tenderness. He exhibits no edema.  Minor tenderness to right small finger  Lymphadenopathy:    He has no cervical adenopathy.  Neurological: He is oriented to person, place, and time. He exhibits normal muscle tone. Coordination normal.  Skin: No rash noted. No erythema.  Psychiatric:  Depressed Suicidal  Nursing note and vitals reviewed.    ED Treatments / Results  DIAGNOSTIC STUDIES:  Oxygen Saturation is 100% on room air, normal by my interpretation.    COORDINATION OF CARE:  12:23 PM Discussed treatment plan with pt at bedside and pt agreed to plan.   Labs (all labs ordered are listed, but only abnormal results are displayed) Labs Reviewed - No data to display  EKG  EKG Interpretation None       Radiology No results found.  Procedures Procedures (including critical care time)  Medications Ordered in ED Medications - No data to display   Initial Impression / Assessment and Plan / ED Course  I have reviewed the triage vital signs and the nursing notes.  Pertinent labs & imaging results that were available during my care of the patient were reviewed by me and considered in my medical decision making (see chart for details).  Clinical Course    Labs unremarkable patient depressed and suicidal he will be transferred to behavior health  Final Clinical Impressions(s) / ED Diagnoses   Final diagnoses:  None    New Prescriptions New Prescriptions   No medications on file   The chart was scribed for me under my direct supervision.  I personally performed the history, physical, and medical decision making and all procedures in the evaluation of this patient.Bethann Berkshire.     Glenn Gullickson,  MD 01/28/16 [id number]

## 2016-01-28 NOTE — Progress Notes (Signed)
Admission Note: Admitted patient, a 35 y/o male, to [facility].  He was transferred from [facility], where he presented stating that he had a desire to hang himself. On arrival to [facility] patient denies suicidal and homicidal ideation but reports feeling depressed.  He says that he uses $200-$300 dollars worth of cocaine daily.  He denies AVH.  Patient says that he lives with his wife and children.  He says that his wife is very supportive and that he has to get him self straightened out for the sake of his family.  He is cooperative with flat affect and depressed/anxious mood.  He verbally contracts for safety.  Skin assessment done with no abnormalities noted. No contraband found on patient.  He brought no belongings and is in hospital scrubs. He reports that he has only had two or three beers in the past 2 years and denies abuse of prescription drugs.  He does report chronic pain to his right 5th finger from a previous injury and surgery.  Patient oriented to unit and plan of care.  Emotional support provided; encouraged him to seek assistance with needs/concerns.

## 2016-01-28 NOTE — Progress Notes (Signed)
Pt on unit A/O in bed watching TV.  Pt is friendly and cooperative.  Reports pain in "pinky" finger of R hand and sts it is fractured.  Pt sts current pain noti not working.  Pt is Hispanic with accent and has some inability to choose proper words.  Pt currently has bed at [facility] pending insurance approval.   Pt seen by provider.  Pt has new meds pending.  Pt watching TV Pt continuously observed for safety except when in the bathroom.  Pt remains safe.

## 2016-01-28 NOTE — ED Notes (Signed)
Pt resting with his eyes closed. Wife still at bedside.

## 2016-01-28 NOTE — ED Triage Notes (Signed)
Pt comes in to ED today to seek help for his drug problem. Oxycodone was being used until 6 months ago then switched to cocaine. Last used x2 days ago. Denies any habitual alcohol use. Pt denies any HI thoughts. He does admit to SI. He states he has a plan to hang himself and states, "if I had a gun in my hand I would kill myself."

## 2016-01-28 NOTE — ED Notes (Signed)
Pt eating lunch

## 2016-01-28 NOTE — ED Notes (Signed)
Pt speaking with [facility].  

## 2016-01-28 NOTE — [facility] Counselor (Signed)
This Probation officer met with pt post admission to determine a disposition plan. Patient informed this Probation officer that he wants to go to a 28 day residential treatment facility.This Probation officer did some research and called the [facility] to check bed availability. This Probation officer was informed by Caryl Pina that beds are currently available. This writer proceeded to get patient to sign ROI form and assisted in completing the screening information for this pt. Pt has a bed pending all insurance information clears and referral information is reviewed by a clinician. Caryl Pina also reports that their facility can provide transportation to the pt if it is an issue. This Probation officer relayed all of the information to the patient and he was receptive. Caryl Pina will call this writer back by 7pm to provide confirmation of bed placement. This Probation officer will fax over supporting documentation to the admissions department on behalf of pt.

## 2016-01-28 NOTE — H&P (Signed)
[place] Observation Unit Provider Admission PAA/H&P  Patient Identification: Jason Rich MRN:  [id number] Date of Evaluation:  01/28/2016 Chief Complaint:  Concerns with cocaine addcition Principal Diagnosis: Substance Induced Mood Disorder, Major Depressive Disorder recurrent moderate, GAD  Patient Active Problem List   Diagnosis Date Noted  . 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 Fracture of finger, distal phalanx, right, open [S62.639B] 05/30/2013  . Pain in joint, hand [M25.549] 05/30/2013   History of Present Illness: Mr. Fullilove has been admitted to the [facility] Observation Unit from [facility] ED due to concerns with ongoing depression since 2014 subsequent to a work related injury and Cocaine addiction over the last 6 months. Mr. Kronenberger states he has been snorting cocaine daily for the last 2 months but using over the last 6 months. His daily use comes to $ 400.00 daily. The illicit drug use has called financial hardships. He was using the cocaine for self medicating due to ongoing depressive symptoms to include feeling of hopelessness, worthlessness, guilt, sadness and despair related to a work related injury to his right hand in 2014. He is denying any SI/SA or HI at this present time. He is denying any concerns with panic attacks but endorses worrying, racing thoughts, ruminating and sleep disturbance. He is right hand dominant and is endorsing neuropathic pain and numbness.  Associated Signs/Symptoms: Depression Symptoms:  depressed mood, insomnia, feelings of worthlessness/guilt, (Hypo) Manic Symptoms:  n/a Anxiety Symptoms:  Excessive Worry, Psychotic Symptoms:  n/a PTSD Symptoms: Negative Total Time spent with patient: 30 minutes  Past Psychiatric History: depression  Is the patient at risk to self? No.  Has the patient been a risk to self in  the past 6 months? No.  Has the patient been a risk to self within the distant past? No.  Is the patient a risk to others? No.  Has the patient been a risk to others in the past 6 months? No.  Has the patient been a risk to others within the distant past? No.   Prior Inpatient Therapy:  no Prior Outpatient Therapy:  no  Alcohol Screening: 1. How often do you have a drink containing alcohol?: Never 9. Have you or someone else been injured as a result of your drinking?: No 10. Has a relative or friend or a doctor or another health worker been concerned about your drinking or suggested you cut down?: No Alcohol Use Disorder Identification Test Final Score (AUDIT): 0 Brief Intervention: AUDIT score less than 7 or less-screening does not suggest unhealthy drinking-brief intervention not indicated Substance Abuse History in the last 12 months:  No. Consequences of Substance Abuse: financial consequences Previous Psychotropic Medications: Yes  Psychological Evaluations: Yes  Past Medical History:  Past Medical History:  Diagnosis Date  . Hypertension     Past Surgical History:  Procedure Laterality Date  . FINGER SURGERY Right    Family History: History reviewed. No pertinent family history. Family Psychiatric History: alcohol/polysubstance abuse Tobacco Screening: Have you used any form of tobacco in the last 30 days? (Cigarettes, Smokeless Tobacco, Cigars, and/or Pipes): Yes Tobacco use, Select all that apply: 5 or more cigarettes per day Are you interested in Tobacco Cessation Medications?: No, patient refused Counseled patient on smoking cessation including recognizing danger situations, developing coping skills and basic information about quitting provided: Refused/Declined practical counseling Social History:  History  Alcohol Use No  Comment: States he has only had 2 or 3 beers in last 2 years     History  Drug Use  . Types: Cocaine    Comment: daily use    Additional  Social History:                           Allergies:  No Known Allergies Lab Results:  Results for orders placed or performed during the hospital encounter of 01/28/16 (from the past 48 hour(s))  Comprehensive metabolic panel     Status: Abnormal   Collection Time: 01/28/16 12:35 PM  Result Value Ref Range   Sodium 137 135 - 145 mmol/L   Potassium 3.7 3.5 - 5.1 mmol/L   Chloride 103 101 - 111 mmol/L   CO2 28 22 - 32 mmol/L   Glucose, Bld 115 (H) 65 - 99 mg/dL   BUN 20 6 - 20 mg/dL   Creatinine, Ser 0.98 0.61 - 1.24 mg/dL   Calcium 9.8 8.9 - 10.3 mg/dL   Total Protein 7.8 6.5 - 8.1 g/dL   Albumin 5.0 3.5 - 5.0 g/dL   AST 27 15 - 41 U/L   ALT 27 17 - 63 U/L   Alkaline Phosphatase 110 38 - 126 U/L   Total Bilirubin 0.8 0.3 - 1.2 mg/dL   GFR calc non Af Amer >60 >60 mL/min   GFR calc Af Amer >60 >60 mL/min    Comment: (NOTE) The eGFR has been calculated using the CKD EPI equation. This calculation has not been validated in all clinical situations. eGFR's persistently <60 mL/min signify possible Chronic Kidney Disease.    Anion gap 6 5 - 15  Ethanol     Status: None   Collection Time: 01/28/16 12:35 PM  Result Value Ref Range   Alcohol, Ethyl (B) <5 <5 mg/dL    Comment:        LOWEST DETECTABLE LIMIT FOR SERUM ALCOHOL IS 5 mg/dL FOR MEDICAL PURPOSES ONLY   Salicylate level     Status: None   Collection Time: 01/28/16 12:35 PM  Result Value Ref Range   Salicylate Lvl <3.7 2.8 - 30.0 mg/dL  Acetaminophen level     Status: Abnormal   Collection Time: 01/28/16 12:35 PM  Result Value Ref Range   Acetaminophen (Tylenol), Serum <10 (L) 10 - 30 ug/mL    Comment:        THERAPEUTIC CONCENTRATIONS VARY SIGNIFICANTLY. A RANGE OF 10-30 ug/mL MAY BE AN EFFECTIVE CONCENTRATION FOR MANY PATIENTS. HOWEVER, SOME ARE BEST TREATED AT CONCENTRATIONS OUTSIDE THIS RANGE. ACETAMINOPHEN CONCENTRATIONS >150 ug/mL AT 4 HOURS AFTER INGESTION AND >50 ug/mL AT 12 HOURS AFTER  INGESTION ARE OFTEN ASSOCIATED WITH TOXIC REACTIONS.   cbc     Status: None   Collection Time: 01/28/16 12:35 PM  Result Value Ref Range   WBC 9.0 4.0 - 10.5 K/uL   RBC 5.34 4.22 - 5.81 MIL/uL   Hemoglobin 17.0 13.0 - 17.0 g/dL   HCT 49.2 39.0 - 52.0 %   MCV 92.1 78.0 - 100.0 fL   MCH 31.8 26.0 - 34.0 pg   MCHC 34.6 30.0 - 36.0 g/dL   RDW 13.8 11.5 - 15.5 %   Platelets 221 150 - 400 K/uL  Rapid urine drug screen (hospital performed)     Status: Abnormal   Collection Time: 01/28/16  2:45 PM  Result Value Ref Range   Opiates NONE DETECTED NONE DETECTED   Cocaine POSITIVE (A)  NONE DETECTED   Benzodiazepines NONE DETECTED NONE DETECTED   Amphetamines NONE DETECTED NONE DETECTED   Tetrahydrocannabinol NONE DETECTED NONE DETECTED   Barbiturates NONE DETECTED NONE DETECTED    Comment:        DRUG SCREEN FOR MEDICAL PURPOSES ONLY.  IF CONFIRMATION IS NEEDED FOR ANY PURPOSE, NOTIFY LAB WITHIN 5 DAYS.        LOWEST DETECTABLE LIMITS FOR URINE DRUG SCREEN Drug Class       Cutoff (ng/mL) Amphetamine      1000 Barbiturate      200 Benzodiazepine   062 Tricyclics       376 Opiates          300 Cocaine          300 THC              50     Blood Alcohol level:  Lab Results  Component Value Date   ETH <5 28/31/5176    Metabolic Disorder Labs:  No results found for: HGBA1C, MPG No results found for: PROLACTIN No results found for: CHOL, TRIG, HDL, CHOLHDL, VLDL, LDLCALC  Current Medications: Current Facility-Administered Medications  Medication Dose Route Frequency Provider Last Rate Last Dose  . acetaminophen (TYLENOL) tablet 650 mg  650 mg Oral Q6H PRN Niel Hummer, NP      . alum & mag hydroxide-simeth (MAALOX/MYLANTA) 200-200-20 MG/5ML suspension 30 mL  30 mL Oral Q4H PRN Niel Hummer, NP      . DULoxetine (CYMBALTA) DR capsule 20 mg  20 mg Oral BID Laverle Hobby, PA-C      . gabapentin (NEURONTIN) capsule 400 mg  400 mg Oral TID Laverle Hobby, PA-C      .  hydrOXYzine (ATARAX/VISTARIL) tablet 25 mg  25 mg Oral Q6H PRN Niel Hummer, NP      . ibuprofen (ADVIL,MOTRIN) tablet 800 mg  800 mg Oral Q8H PRN Laverle Hobby, PA-C      . lisinopril (PRINIVIL,ZESTRIL) tablet 20 mg  20 mg Oral Daily Niel Hummer, NP   20 mg at 01/28/16 1744  . magnesium hydroxide (MILK OF MAGNESIA) suspension 30 mL  30 mL Oral Daily PRN Niel Hummer, NP      . pravastatin (PRAVACHOL) tablet 20 mg  20 mg Oral Daily Niel Hummer, NP      . traZODone (DESYREL) tablet 50 mg  50 mg Oral QHS PRN Niel Hummer, NP       PTA Medications: Prescriptions Prior to Admission  Medication Sig Dispense Refill Last Dose  . acetaminophen (TYLENOL) 500 MG tablet Take 1,000 mg by mouth every 6 (six) hours as needed for pain.   01/28/2016 at Unknown time  . HYDROcodone-acetaminophen (NORCO/VICODIN) 5-325 MG tablet Take 2 tablets by mouth every 4 (four) hours as needed. (Patient not taking: Reported on 01/28/2016) 6 tablet 0 Not Taking at Unknown time  . ibuprofen (ADVIL,MOTRIN) 200 MG tablet Take 200 mg by mouth every 6 (six) hours as needed for moderate pain.   Past Week at Unknown time  . lisinopril (PRINIVIL,ZESTRIL) 20 MG tablet Take 20 mg by mouth daily.  2 01/27/2016 at Unknown time  . Melatonin 5 MG TABS Take 1 tablet by mouth at bedtime.   01/27/2016 at Unknown time  . pravastatin (PRAVACHOL) 20 MG tablet Take 20 mg by mouth daily.  5 01/27/2016 at Unknown time    Musculoskeletal: Strength & Muscle Tone: within normal limits Gait &  Station: normal Patient leans: N/A  Psychiatric Specialty Exam: Physical Exam  Constitutional: He is oriented to person, place, and time. He appears well-developed and well-nourished. No distress.  HENT:  Head: Normocephalic.  Eyes: Pupils are equal, round, and reactive to light.  Neurological: He is alert and oriented to person, place, and time. No cranial nerve deficit.  Skin: Skin is warm and dry. He is not diaphoretic.  Psychiatric: His speech is  normal and behavior is normal. Judgment and thought content normal. His mood appears anxious. Cognition and memory are normal. He exhibits a depressed mood.    Review of Systems  Constitutional: Negative.   Neurological: Positive for tingling, sensory change and focal weakness.  Psychiatric/Behavioral: Positive for depression, memory loss and substance abuse. Negative for hallucinations and suicidal ideas. The patient is nervous/anxious and has insomnia.     Blood pressure 129/80, pulse 80, temperature 98.4 F (36.9 C), temperature source Oral, resp. rate 16, height '5\' 5"'$  (1.651 m), weight 78 kg (172 lb), SpO2 100 %.Body mass index is 28.62 kg/m.  General Appearance: Casual  Eye Contact:  Good  Speech:  Clear and Coherent  Volume:  Normal  Mood:  Depressed  Affect:  Congruent  Thought Process:  Coherent  Orientation:  Full (Time, Place, and Person)  Thought Content:  Negative  Suicidal Thoughts:  No  Homicidal Thoughts:  No  Memory:  Immediate;   Good  Judgement:  Poor  Insight:  Lacking  Psychomotor Activity:  Negative  Concentration:  Concentration: Good  Recall:  Good  Fund of Knowledge:  Good  Language:  Negative  Akathisia:  Negative  Handed:  Right  AIMS (if indicated):     Assets:  Desire for Improvement  ADL's:  Intact  Cognition:  WNL  Sleep:         Treatment Plan Summary: Plan Admit to [facility] observation unit for crises management, safety and stabilization. Will initiate Cymbalata 20 mg BID, Gabapentin 400 mg TID for treatment of depression, mood stabilization and adjunct for pain managment.  Observation Level/Precautions:  Continuous observation Laboratory:   Psychotherapy:   Medications:   Consultations:   Discharge Concerns:   Estimated LOS: Other:      Trenace Coughlin E, PA-C 8/15/20178:40 PM

## 2016-01-28 NOTE — ED Notes (Signed)
Pt sitting on bed with wife at bedside. Pt calm and cooperative.

## 2016-01-28 NOTE — ED Notes (Signed)
TTS in progress 

## 2016-01-29 DIAGNOSIS — Z89021 Acquired absence of right finger(s): Secondary | ICD-10-CM | POA: Diagnosis not present

## 2016-01-29 DIAGNOSIS — F1420 Cocaine dependence, uncomplicated: Secondary | ICD-10-CM | POA: Diagnosis not present

## 2016-01-29 DIAGNOSIS — I10 Essential (primary) hypertension: Secondary | ICD-10-CM | POA: Diagnosis not present

## 2016-01-29 DIAGNOSIS — F332 Major depressive disorder, recurrent severe without psychotic features: Secondary | ICD-10-CM | POA: Diagnosis not present

## 2016-01-29 DIAGNOSIS — F3289 Other specified depressive episodes: Secondary | ICD-10-CM | POA: Diagnosis not present

## 2016-01-29 DIAGNOSIS — F1424 Cocaine dependence with cocaine-induced mood disorder: Secondary | ICD-10-CM

## 2016-01-29 DIAGNOSIS — F411 Generalized anxiety disorder: Secondary | ICD-10-CM | POA: Diagnosis not present

## 2016-01-29 MED ORDER — DULOXETINE HCL 20 MG PO CPEP: 20.0000 mg | ORAL_CAPSULE | Freq: Two times a day (BID) | ORAL | 0 refills | Status: AC

## 2016-01-29 MED ORDER — HYDROXYZINE HCL 25 MG PO TABS: 25.0000 mg | ORAL_TABLET | Freq: Four times a day (QID) | ORAL | 0 refills | Status: AC | PRN

## 2016-01-29 MED ORDER — TRAZODONE HCL 50 MG PO TABS: 50.0000 mg | ORAL_TABLET | Freq: Every evening | ORAL | 0 refills | Status: AC | PRN

## 2016-01-29 MED ORDER — GABAPENTIN 400 MG PO CAPS: 400.0000 mg | ORAL_CAPSULE | Freq: Three times a day (TID) | ORAL | 0 refills | Status: DC

## 2016-01-29 NOTE — Discharge Instructions (Signed)
Pt will be transported to [facility] by one of their drivers, pt will continue his treatment/detox there from 3-4 weeks. Pt will be discharged with prescriptions.

## 2016-01-29 NOTE — [facility] Counselor (Signed)
Jason Rich from [facility] called back with an acceptance proposal for this pt, but patient declined due to having to meet the out of pocket expenses of $3,000 upon arrival.

## 2016-01-29 NOTE — [facility] Counselor (Signed)
Pt has been accepted to [facility], and will be transported by one of their drivers and will be there between 3-4 weeks.  Pt will be leaving as soon as the driver gets here.

## 2016-01-29 NOTE — Progress Notes (Signed)
Patient A/O, no noted distress. He has been pleasant and cooperative this morning. Admits to depression. Patient admits of having continuous SI. He has no guns in the home resulted in his children staying there. He stated, "when I looked at my children I just want to take my life because I do not want my children to watch me suffer. My family noticed that I was not having any money, spouse thought it was another women until I admitted to using cocaine." He also notes, "family was surprised" of his admission. Patient contracted for safety. Patient c/o pain he injured his right hand 1st digit at work 7/10. The usage of cocaine is also use for the pain, so he can continue to work. VS WNL.  Staff continuing to find placement for patient. Staff will continue to monitor, meet needs, and maintain safety.

## 2016-01-29 NOTE — Discharge Summary (Signed)
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 MDD (major depressive disorder), recurrent severe, without psychosis ([facility]) [F33.2] 01/29/2016    Priority: High  . 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 Fracture of finger, distal phalanx, right, open [S62.639B] 05/30/2013  . Pain in joint, hand [M25.549] 05/30/2013   Subjective: Pt seen and chart reviewed. Pt is alert/oriented x4, calm, cooperative, and appropriate to situation. Pt denies suicidal/homicidal ideation and psychosis and does not appear to be responding to internal stimuli. Pt is optimistic about rehab placement and has 2 potential bed placements today.  History of Present Illness: I have reviewed and concur with HPI elements below, modified as follows: Mr. Cuevas has been admitted to the [facility] from [facility] due to concerns with ongoing depression since 2014 subsequent to a work related injury and Cocaine addiction over the last 6 months. Mr. Torti states he has been snorting cocaine daily for the last 2 months but using over the last 6 months. His daily use comes to $ 400.00 daily. The illicit drug use has called financial hardships. He was using the cocaine for self medicating due to ongoing depressive symptoms to include feeling of hopelessness, worthlessness, guilt, sadness and despair related to a work related injury to his right hand in 2014. He is denying any SI/SA or HI at this present time. He is denying any concerns with panic attacks but endorses  worrying, racing thoughts, ruminating and sleep disturbance. He is right hand dominant and is endorsing neuropathic pain and numbness.   Pt spent the night in the [facility] without incident and is stable for discharge att his time. See plan below on 01/29/16  Total Time spent with patient: 30 minutes  Past Psychiatric History: depression  Substance Abuse History in the last 12 months:  YES Consequences of Substance Abuse: financial consequences Previous Psychotropic Medications: Yes  Psychological Evaluations: Yes  Past Medical History:  Past Medical History:  Diagnosis Date  . Hypertension     Past Surgical History:  Procedure Laterality Date  . FINGER SURGERY Right    Family History: History reviewed. No pertinent family history. Family Psychiatric History: alcohol/polysubstance abuse Tobacco Screening: Have you used any form of tobacco in the last 30 days? (Cigarettes, Smokeless Tobacco, Cigars, and/or Pipes): Yes Tobacco use, Select all that apply: 5 or more cigarettes per day Are you interested in Tobacco Cessation Medications?: No, patient refused Counseled patient on smoking cessation including recognizing danger situations, developing coping skills and basic information about quitting provided: Refused/Declined practical counseling Social History:  History  Alcohol Use No    Comment: States he has only had 2 or 3 beers in last 2 years     History  Drug Use  . Types: Cocaine    Comment: daily use    Additional Social History:                           Allergies:  No Known Allergies Lab Results:  Results for orders placed or performed during the [facility] encounter of 01/28/16 (from  the past 48 hour(s))  Comprehensive metabolic panel     Status: Abnormal   Collection Time: 01/28/16 12:35 PM  Result Value Ref Range   Sodium 137 135 - 145 mmol/L   Potassium 3.7 3.5 - 5.1 mmol/L   Chloride 103 101 - 111 mmol/L   CO2 28 22 - 32 mmol/L   Glucose, Bld  115 (H) 65 - 99 mg/dL   BUN 20 6 - 20 mg/dL   Creatinine, Ser 0.98 0.61 - 1.24 mg/dL   Calcium 9.8 8.9 - 10.3 mg/dL   Total Protein 7.8 6.5 - 8.1 g/dL   Albumin 5.0 3.5 - 5.0 g/dL   AST 27 15 - 41 U/L   ALT 27 17 - 63 U/L   Alkaline Phosphatase 110 38 - 126 U/L   Total Bilirubin 0.8 0.3 - 1.2 mg/dL   GFR calc non Af Amer >60 >60 mL/min   GFR calc Af Amer >60 >60 mL/min    Comment: (NOTE) The eGFR has been calculated using the CKD EPI equation. This calculation has not been validated in all clinical situations. eGFR's persistently <60 mL/min signify possible Chronic Kidney Disease.    Anion gap 6 5 - 15  Ethanol     Status: None   Collection Time: 01/28/16 12:35 PM  Result Value Ref Range   Alcohol, Ethyl (B) <5 <5 mg/dL    Comment:        LOWEST DETECTABLE LIMIT FOR SERUM ALCOHOL IS 5 mg/dL FOR MEDICAL PURPOSES ONLY   Salicylate level     Status: None   Collection Time: 01/28/16 12:35 PM  Result Value Ref Range   Salicylate Lvl <3.8 2.8 - 30.0 mg/dL  Acetaminophen level     Status: Abnormal   Collection Time: 01/28/16 12:35 PM  Result Value Ref Range   Acetaminophen (Tylenol), Serum <10 (L) 10 - 30 ug/mL    Comment:        THERAPEUTIC CONCENTRATIONS VARY SIGNIFICANTLY. A RANGE OF 10-30 ug/mL MAY BE AN EFFECTIVE CONCENTRATION FOR MANY PATIENTS. HOWEVER, SOME ARE BEST TREATED AT CONCENTRATIONS OUTSIDE THIS RANGE. ACETAMINOPHEN CONCENTRATIONS >150 ug/mL AT 4 HOURS AFTER INGESTION AND >50 ug/mL AT 12 HOURS AFTER INGESTION ARE OFTEN ASSOCIATED WITH TOXIC REACTIONS.   cbc     Status: None   Collection Time: 01/28/16 12:35 PM  Result Value Ref Range   WBC 9.0 4.0 - 10.5 K/uL   RBC 5.34 4.22 - 5.81 MIL/uL   Hemoglobin 17.0 13.0 - 17.0 g/dL   HCT 49.2 39.0 - 52.0 %   MCV 92.1 78.0 - 100.0 fL   MCH 31.8 26.0 - 34.0 pg   MCHC 34.6 30.0 - 36.0 g/dL   RDW 13.8 11.5 - 15.5 %   Platelets 221 150 - 400 K/uL  Rapid urine drug screen (Rich performed)     Status:  Abnormal   Collection Time: 01/28/16  2:45 PM  Result Value Ref Range   Opiates NONE DETECTED NONE DETECTED   Cocaine POSITIVE (A) NONE DETECTED   Benzodiazepines NONE DETECTED NONE DETECTED   Amphetamines NONE DETECTED NONE DETECTED   Tetrahydrocannabinol NONE DETECTED NONE DETECTED   Barbiturates NONE DETECTED NONE DETECTED    Comment:        DRUG SCREEN FOR MEDICAL PURPOSES ONLY.  IF CONFIRMATION IS NEEDED FOR ANY PURPOSE, NOTIFY LAB WITHIN 5 DAYS.        LOWEST DETECTABLE LIMITS FOR URINE DRUG SCREEN Drug Class       Cutoff (ng/mL) Amphetamine  1000 Barbiturate      200 Benzodiazepine   235 Tricyclics       573 Opiates          300 Cocaine          300 THC              50     Blood Alcohol level:  Lab Results  Component Value Date   ETH <5 22/07/5425    Metabolic Disorder Labs:  No results found for: HGBA1C, MPG No results found for: PROLACTIN No results found for: CHOL, TRIG, HDL, CHOLHDL, VLDL, LDLCALC  Current Medications: Current Facility-Administered Medications  Medication Dose Route Frequency Provider Last Rate Last Dose  . acetaminophen (TYLENOL) tablet 650 mg  650 mg Oral Q6H PRN Niel Hummer, NP      . alum & mag hydroxide-simeth (MAALOX/MYLANTA) 200-200-20 MG/5ML suspension 30 mL  30 mL Oral Q4H PRN Niel Hummer, NP      . DULoxetine (CYMBALTA) DR capsule 20 mg  20 mg Oral BID Laverle Hobby, PA-C   20 mg at 01/29/16 0623  . gabapentin (NEURONTIN) capsule 400 mg  400 mg Oral TID Laverle Hobby, PA-C   400 mg at 01/29/16 1159  . hydrOXYzine (ATARAX/VISTARIL) tablet 25 mg  25 mg Oral Q6H PRN Niel Hummer, NP      . ibuprofen (ADVIL,MOTRIN) tablet 800 mg  800 mg Oral Q8H PRN Laverle Hobby, PA-C      . lisinopril (PRINIVIL,ZESTRIL) tablet 20 mg  20 mg Oral Daily Niel Hummer, NP   20 mg at 01/29/16 7628  . magnesium hydroxide (MILK OF MAGNESIA) suspension 30 mL  30 mL Oral Daily PRN Niel Hummer, NP      . pravastatin (PRAVACHOL) tablet 20 mg   20 mg Oral Daily Niel Hummer, NP   20 mg at 01/29/16 3151  . traZODone (DESYREL) tablet 50 mg  50 mg Oral QHS PRN Niel Hummer, NP       PTA Medications: Prescriptions Prior to Admission  Medication Sig Dispense Refill Last Dose  . acetaminophen (TYLENOL) 500 MG tablet Take 1,000 mg by mouth every 6 (six) hours as needed for pain.   01/28/2016 at Unknown time  . HYDROcodone-acetaminophen (NORCO/VICODIN) 5-325 MG tablet Take 2 tablets by mouth every 4 (four) hours as needed. (Patient not taking: Reported on 01/28/2016) 6 tablet 0 Not Taking at Unknown time  . ibuprofen (ADVIL,MOTRIN) 200 MG tablet Take 200 mg by mouth every 6 (six) hours as needed for moderate pain.   Past Week at Unknown time  . lisinopril (PRINIVIL,ZESTRIL) 20 MG tablet Take 20 mg by mouth daily.  2 01/27/2016 at Unknown time  . Melatonin 5 MG TABS Take 1 tablet by mouth at bedtime.   01/27/2016 at Unknown time  . pravastatin (PRAVACHOL) 20 MG tablet Take 20 mg by mouth daily.  5 01/27/2016 at Unknown time    Musculoskeletal: Strength & Muscle Tone: within normal limits Gait & Station: normal Patient leans: N/A  Psychiatric Specialty Exam: Physical Exam  Constitutional: He is oriented to person, place, and time. He appears well-developed and well-nourished. No distress.  HENT:  Head: Normocephalic.  Eyes: Pupils are equal, round, and reactive to light.  Neurological: He is alert and oriented to person, place, and time. No cranial nerve deficit.  Skin: Skin is warm and dry. He is not diaphoretic.  Psychiatric: His speech is normal and behavior is normal. Judgment  and thought content normal. His mood appears anxious. Cognition and memory are normal. He exhibits a depressed mood.    Review of Systems  Constitutional: Negative.   Neurological: Negative for tingling, sensory change and focal weakness.  Psychiatric/Behavioral: Positive for depression and substance abuse. Negative for hallucinations, memory loss and suicidal  ideas. The patient is nervous/anxious and has insomnia.     Blood pressure 118/73, pulse 82, temperature 98.5 F (36.9 C), temperature source Oral, resp. rate 18, height _0  (1.651 m), weight 78 kg (172 lb), SpO2 100 %.Body mass index is 28.62 kg/m.  General Appearance: Casual and fairly groomed   Eye Contact:  Good  Speech:  Clear and Coherent  Volume:  Normal  Mood:  Depressed  Affect:  Congruent  Thought Process:  Coherent  Orientation:  Full (Time, Place, and Person)  Thought Content:  Discharge plans  Suicidal Thoughts:  No  Homicidal Thoughts:  No  Memory:  Immediate;   Good  Judgement:  Poor  Insight:  Lacking  Psychomotor Activity:  Negative  Concentration:  Concentration: Good  Recall:  Good  Fund of Knowledge:  Good  Language:  Negative  Akathisia:  Negative  Handed:  Right  AIMS (if indicated):     Assets:  Desire for Improvement  ADL's:  Intact  Cognition:  WNL  Sleep:      Treatment Plan Summary: 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

## 2016-01-29 NOTE — [facility] Counselor (Signed)
OBS Counselor [facility] Pt signed ROI providing this Clinical research associatewriter with permission to submit information. This information was addressed to "Chrissie NoaWilliam" [facility].

## 2016-01-29 NOTE — Progress Notes (Addendum)
Patient A/O, no noted distress. Denies pain. Patient being [place]. Patient continue to be motivated to attend inpatient treatment. Patient signed all necessary documents. Educated patient and spouse on discharge instructions, the facility, and prescriptions. Patient has all belongings.

## 2016-01-30 DIAGNOSIS — F1420 Cocaine dependence, uncomplicated: Secondary | ICD-10-CM | POA: Diagnosis not present

## 2016-01-31 DIAGNOSIS — F1420 Cocaine dependence, uncomplicated: Secondary | ICD-10-CM | POA: Diagnosis not present

## 2016-02-01 DIAGNOSIS — F1420 Cocaine dependence, uncomplicated: Secondary | ICD-10-CM | POA: Diagnosis not present

## 2016-02-02 DIAGNOSIS — F1420 Cocaine dependence, uncomplicated: Secondary | ICD-10-CM | POA: Diagnosis not present

## 2016-02-03 DIAGNOSIS — F1420 Cocaine dependence, uncomplicated: Secondary | ICD-10-CM | POA: Diagnosis not present

## 2016-02-04 DIAGNOSIS — F1420 Cocaine dependence, uncomplicated: Secondary | ICD-10-CM | POA: Diagnosis not present

## 2016-02-05 DIAGNOSIS — F1420 Cocaine dependence, uncomplicated: Secondary | ICD-10-CM | POA: Diagnosis not present

## 2016-02-06 DIAGNOSIS — F1420 Cocaine dependence, uncomplicated: Secondary | ICD-10-CM | POA: Diagnosis not present

## 2016-02-07 DIAGNOSIS — F1420 Cocaine dependence, uncomplicated: Secondary | ICD-10-CM | POA: Diagnosis not present

## 2016-02-08 DIAGNOSIS — F1420 Cocaine dependence, uncomplicated: Secondary | ICD-10-CM | POA: Diagnosis not present

## 2016-02-09 DIAGNOSIS — F1420 Cocaine dependence, uncomplicated: Secondary | ICD-10-CM | POA: Diagnosis not present

## 2016-02-10 DIAGNOSIS — F1420 Cocaine dependence, uncomplicated: Secondary | ICD-10-CM | POA: Diagnosis not present

## 2016-02-11 DIAGNOSIS — F1420 Cocaine dependence, uncomplicated: Secondary | ICD-10-CM | POA: Diagnosis not present

## 2016-02-12 DIAGNOSIS — F1420 Cocaine dependence, uncomplicated: Secondary | ICD-10-CM | POA: Diagnosis not present

## 2016-02-13 DIAGNOSIS — F1420 Cocaine dependence, uncomplicated: Secondary | ICD-10-CM | POA: Diagnosis not present

## 2016-02-14 DIAGNOSIS — F1420 Cocaine dependence, uncomplicated: Secondary | ICD-10-CM | POA: Diagnosis not present

## 2016-02-15 DIAGNOSIS — F1420 Cocaine dependence, uncomplicated: Secondary | ICD-10-CM | POA: Diagnosis not present

## 2016-02-16 DIAGNOSIS — F1420 Cocaine dependence, uncomplicated: Secondary | ICD-10-CM | POA: Diagnosis not present

## 2016-02-17 DIAGNOSIS — F1420 Cocaine dependence, uncomplicated: Secondary | ICD-10-CM | POA: Diagnosis not present

## 2016-02-18 DIAGNOSIS — F1420 Cocaine dependence, uncomplicated: Secondary | ICD-10-CM | POA: Diagnosis not present

## 2016-02-20 DIAGNOSIS — F1420 Cocaine dependence, uncomplicated: Secondary | ICD-10-CM | POA: Diagnosis not present

## 2016-02-24 DIAGNOSIS — F1420 Cocaine dependence, uncomplicated: Secondary | ICD-10-CM | POA: Diagnosis not present

## 2016-02-25 DIAGNOSIS — Z1389 Encounter for screening for other disorder: Secondary | ICD-10-CM | POA: Diagnosis not present

## 2016-02-25 DIAGNOSIS — Z6827 Body mass index (BMI) 27.0-27.9, adult: Secondary | ICD-10-CM | POA: Diagnosis not present

## 2016-02-25 DIAGNOSIS — F330 Major depressive disorder, recurrent, mild: Secondary | ICD-10-CM | POA: Diagnosis not present

## 2016-02-25 DIAGNOSIS — F411 Generalized anxiety disorder: Secondary | ICD-10-CM | POA: Diagnosis not present

## 2016-02-25 DIAGNOSIS — I10 Essential (primary) hypertension: Secondary | ICD-10-CM | POA: Diagnosis not present

## 2016-02-25 MED FILL — HYDROCHLOROTHIAZIDE 25 MG T: 25 | 90 days supply | Qty: 90 | Fill #0

## 2016-02-25 MED FILL — traZODone HCL 50 MG TABS: 50 | 90 days supply | Qty: 90 | Fill #0

## 2016-02-25 MED FILL — DULoxetine HCL 30 MG CPEP: 30 | 90 days supply | Qty: 90 | Fill #0

## 2016-02-25 MED FILL — GABAPENTIN 600 MG TABLET: 600 | 90 days supply | Qty: 270 | Fill #0

## 2016-02-25 MED FILL — MELOXICAM 7.5 MG TABLET: 7.5 | 30 days supply | Qty: 60 | Fill #0

## 2016-02-25 MED FILL — hydrOXYzine HCL 25 MG TABS: 25 | 15 days supply | Qty: 60 | Fill #0

## 2016-03-02 DIAGNOSIS — F1420 Cocaine dependence, uncomplicated: Secondary | ICD-10-CM | POA: Diagnosis not present

## 2016-03-04 DIAGNOSIS — F1420 Cocaine dependence, uncomplicated: Secondary | ICD-10-CM | POA: Diagnosis not present

## 2016-03-12 DIAGNOSIS — F1420 Cocaine dependence, uncomplicated: Secondary | ICD-10-CM | POA: Diagnosis not present

## 2016-03-12 DIAGNOSIS — F325 Major depressive disorder, single episode, in full remission: Secondary | ICD-10-CM | POA: Diagnosis not present

## 2016-03-18 DIAGNOSIS — F325 Major depressive disorder, single episode, in full remission: Secondary | ICD-10-CM | POA: Diagnosis not present

## 2016-03-18 DIAGNOSIS — F1420 Cocaine dependence, uncomplicated: Secondary | ICD-10-CM | POA: Diagnosis not present

## 2016-03-24 MED FILL — MELOXICAM 7.5 MG TABLET: 7.5 | 30 days supply | Qty: 60 | Fill #1

## 2016-03-27 ENCOUNTER — Emergency Department (HOSPITAL_COMMUNITY)
Admission: EM | Admit: 2016-03-27 | Discharge: 2016-03-27 | Disposition: A | Discharge status: Home / Self Care | Payer: 59 | Attending: Emergency Medicine | Admitting: Emergency Medicine

## 2016-03-27 ENCOUNTER — Emergency Department (HOSPITAL_COMMUNITY): Payer: 59

## 2016-03-27 ENCOUNTER — Encounter (HOSPITAL_COMMUNITY): Payer: Self-pay | Admitting: *Deleted

## 2016-03-27 DIAGNOSIS — F17210 Nicotine dependence, cigarettes, uncomplicated: Secondary | ICD-10-CM | POA: Diagnosis not present

## 2016-03-27 DIAGNOSIS — M5442 Lumbago with sciatica, left side: Secondary | ICD-10-CM | POA: Diagnosis not present

## 2016-03-27 DIAGNOSIS — M1990 Unspecified osteoarthritis, unspecified site: Secondary | ICD-10-CM | POA: Diagnosis not present

## 2016-03-27 DIAGNOSIS — M5432 Sciatica, left side: Secondary | ICD-10-CM

## 2016-03-27 DIAGNOSIS — I10 Essential (primary) hypertension: Secondary | ICD-10-CM | POA: Diagnosis not present

## 2016-03-27 DIAGNOSIS — Z79899 Other long term (current) drug therapy: Secondary | ICD-10-CM | POA: Diagnosis not present

## 2016-03-27 DIAGNOSIS — M461 Sacroiliitis, not elsewhere classified: Secondary | ICD-10-CM

## 2016-03-27 DIAGNOSIS — M47818 Spondylosis without myelopathy or radiculopathy, sacral and sacrococcygeal region: Secondary | ICD-10-CM

## 2016-03-27 DIAGNOSIS — M545 Low back pain: Secondary | ICD-10-CM | POA: Diagnosis not present

## 2016-03-27 MED ORDER — DIAZEPAM 5 MG PO TABS
10.0000 mg | ORAL_TABLET | Freq: Once | ORAL | Status: AC
  Administered 2016-03-27: 10 mg via ORAL
  Filled 2016-03-27: qty 2

## 2016-03-27 MED ORDER — ONDANSETRON HCL 4 MG PO TABS
4.0000 mg | ORAL_TABLET | Freq: Once | ORAL | Status: AC
  Administered 2016-03-27: 4 mg via ORAL
  Filled 2016-03-27: qty 1

## 2016-03-27 MED ORDER — KETOROLAC TROMETHAMINE 10 MG PO TABS
10.0000 mg | ORAL_TABLET | Freq: Once | ORAL | Status: AC
  Administered 2016-03-27: 10 mg via ORAL
  Filled 2016-03-27: qty 1

## 2016-03-27 MED ORDER — DEXAMETHASONE 4 MG PO TABS: 4.0000 mg | ORAL_TABLET | Freq: Two times a day (BID) | ORAL | 0 refills | Status: AC

## 2016-03-27 MED ORDER — CYCLOBENZAPRINE HCL 10 MG PO TABS: 10.0000 mg | ORAL_TABLET | Freq: Three times a day (TID) | ORAL | 0 refills | Status: DC

## 2016-03-27 MED ORDER — DICLOFENAC SODIUM 75 MG PO TBEC: 75.0000 mg | DELAYED_RELEASE_TABLET | Freq: Two times a day (BID) | ORAL | 0 refills | Status: AC

## 2016-03-27 MED ORDER — TRAMADOL HCL 50 MG PO TABS
100.0000 mg | ORAL_TABLET | Freq: Once | ORAL | Status: AC
  Administered 2016-03-27: 100 mg via ORAL
  Filled 2016-03-27: qty 2

## 2016-03-27 NOTE — ED Triage Notes (Addendum)
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 He got home and his back began hurting. Pt states he has had lower back pain that has had progressively gotten worse over the last several days. Pain worsens with movement.  Pt has minor bruising noted to his lower back.

## 2016-03-27 NOTE — ED Provider Notes (Signed)
[facility] DEPT Provider Note   CSN: [id number] Arrival date & time: 03/27/16  1754     History   Chief Complaint Chief Complaint  Patient presents with  . Back Pain    HPI [facility] is a 35 y.o. male.  Patient is a 35 year old male who presents to the emergency department with complaint of back pain.  Patient gives history that on October 9, he was at work, pulling up plants when he began to have some mild pain in his back. The pain got worse after he got home. The following day it was progressing even more. The patient continues to have pain throughout the week. He presents now for evaluation concerning his lower back. The pain is aggravated by certain movements. He gets some relief from rest and being still, but it does not resolve his pain. There's been no loss of bowel or bladder function. The patient has no cough or respiratory related illness. No previous operations or procedures involving the lower back.    Back Pain   Pertinent negatives include no chest pain, no abdominal pain and no dysuria.    Past Medical History:  Diagnosis Date  . Hypertension     Patient Active Problem List   Diagnosis Date Noted  . MDD (major depressive disorder), recurrent severe, without psychosis ([facility]) 01/29/2016  . 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 Fracture of finger, distal phalanx, right, open 05/30/2013  . Pain in joint, hand 05/30/2013    Past Surgical History:  Procedure Laterality Date  . FINGER SURGERY Right        Home Medications    Prior to Admission medications   Medication Sig Start Date End Date Taking? Authorizing Provider  DULoxetine (CYMBALTA) 20 MG capsule Take 1 capsule (20 mg total) by mouth 2 (two) times daily. 01/29/16   Beau Fanny, FNP  gabapentin (NEURONTIN) 400 MG capsule Take 1 capsule (400 mg total) by mouth 3 (three) times  daily. 01/29/16   Beau Fanny, FNP  hydrOXYzine (ATARAX/VISTARIL) 25 MG tablet Take 1 tablet (25 mg total) by mouth every 6 (six) hours as needed for anxiety. 01/29/16   Beau Fanny, FNP  lisinopril (PRINIVIL,ZESTRIL) 20 MG tablet Take 20 mg by mouth daily. 12/11/15   Historical Provider, MD  pravastatin (PRAVACHOL) 20 MG tablet Take 20 mg by mouth daily. 12/10/15   Historical Provider, MD  traZODone (DESYREL) 50 MG tablet Take 1 tablet (50 mg total) by mouth at bedtime as needed for sleep. 01/29/16   Beau Fanny, FNP    Family History No family history on file.  Social History Social History  Substance Use Topics  . Smoking status: Current Some Day Smoker    Packs/day: 0.50    Types: Cigarettes  . Smokeless tobacco: Never Used     Comment: Patient refused cessation materials  . Alcohol use No     Comment: States he has only had 2 or 3 beers in last 2 years     Allergies   Review of patient's allergies indicates no known allergies.   Review of Systems Review of Systems  Constitutional: Negative for activity change.       All ROS Neg except as noted in HPI  HENT: Negative for nosebleeds.   Eyes: Negative for photophobia and discharge.  Respiratory: Negative for cough, shortness of breath and wheezing.   Cardiovascular: Negative for chest  pain and palpitations.  Gastrointestinal: Negative for abdominal pain and blood in stool.  Genitourinary: Negative for dysuria, frequency and hematuria.  Musculoskeletal: Positive for back pain. Negative for arthralgias and neck pain.  Skin: Negative.   Neurological: Negative for dizziness, seizures and speech difficulty.  Psychiatric/Behavioral: Negative for confusion and hallucinations.       Depression     Physical Exam Updated Vital Signs BP 125/70 (BP Location: Left Arm)   Pulse 94   Temp 98.7 F (37.1 C) (Oral)   Resp 16   Ht 5\' 5"  (1.651 m)   Wt 79.4 kg   SpO2 99%   BMI 29.12 kg/m   Physical Exam   ED Treatments  / Results  Labs (all labs ordered are listed, but only abnormal results are displayed) Labs Reviewed - No data to display  EKG  EKG Interpretation None       Radiology No results found.  Procedures Procedures (including critical care time)  Medications Ordered in ED Medications - No data to display   Initial Impression / Assessment and Plan / ED Course  I have reviewed the triage vital signs and the nursing notes.  Pertinent labs & imaging results that were available during my care of the patient were reviewed by me and considered in my medical decision making (see chart for details).  Clinical Course    *I have reviewed nursing notes, vital signs, and all appropriate lab and imaging results for this patient.**  Final Clinical Impressions(s) / ED Diagnoses No hx of loss of bowel or bladder function. No hx of caudal equina symptoms. CT scan of the lumbar spine shows some mild SI joint degenerative changes, but otherwise within normal limits. The patient is fitted with crutches. He is prescribed Flexeril, Decadron, diclofenac. He is referred to Dr. Charlann Boxerlin for orthopedic evaluation and management of this issue. Questions were answered. Feel that it is safe for the patient be discharged home.    Final diagnoses:  None    New Prescriptions New Prescriptions   No medications on file     Jason QualeHobson Vanette Noguchi, PA-C 03/27/16 2127    Jason JesterKathleen McManus, DO 03/28/16 1649

## 2016-03-27 NOTE — Discharge Instructions (Signed)
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 Please use the crutches until you can safely apply weight to your left side. Heating pad to your back maybe helpful. Please use Flexeril 3 times daily, diclofenac 2 times daily, and Decadron 2 times daily with food until you're seen by Dr. Charlann Boxerlin or the orthopedic specialist of your choice.

## 2016-03-31 ENCOUNTER — Encounter (HOSPITAL_COMMUNITY): Payer: Self-pay | Admitting: Emergency Medicine

## 2016-03-31 ENCOUNTER — Emergency Department (HOSPITAL_COMMUNITY): Payer: 59

## 2016-03-31 ENCOUNTER — Emergency Department (HOSPITAL_COMMUNITY)
Admission: EM | Admit: 2016-03-31 | Discharge: 2016-03-31 | Disposition: A | Discharge status: Home / Self Care | Payer: 59 | Attending: Emergency Medicine | Admitting: Emergency Medicine

## 2016-03-31 DIAGNOSIS — T50902A Poisoning by unspecified drugs, medicaments and biological substances, intentional self-harm, initial encounter: Secondary | ICD-10-CM

## 2016-03-31 DIAGNOSIS — T4272XA Poisoning by unspecified antiepileptic and sedative-hypnotic drugs, intentional self-harm, initial encounter: Secondary | ICD-10-CM | POA: Diagnosis not present

## 2016-03-31 DIAGNOSIS — T1491XA Suicide attempt, initial encounter: Secondary | ICD-10-CM | POA: Insufficient documentation

## 2016-03-31 DIAGNOSIS — K2970 Gastritis, unspecified, without bleeding: Secondary | ICD-10-CM | POA: Diagnosis not present

## 2016-03-31 DIAGNOSIS — I10 Essential (primary) hypertension: Secondary | ICD-10-CM | POA: Insufficient documentation

## 2016-03-31 DIAGNOSIS — K2960 Other gastritis without bleeding: Secondary | ICD-10-CM

## 2016-03-31 DIAGNOSIS — Z79899 Other long term (current) drug therapy: Secondary | ICD-10-CM | POA: Diagnosis not present

## 2016-03-31 DIAGNOSIS — X58XXXA Exposure to other specified factors, initial encounter: Secondary | ICD-10-CM | POA: Insufficient documentation

## 2016-03-31 DIAGNOSIS — F17210 Nicotine dependence, cigarettes, uncomplicated: Secondary | ICD-10-CM | POA: Insufficient documentation

## 2016-03-31 DIAGNOSIS — Y939 Activity, unspecified: Secondary | ICD-10-CM | POA: Diagnosis not present

## 2016-03-31 DIAGNOSIS — R109 Unspecified abdominal pain: Secondary | ICD-10-CM | POA: Insufficient documentation

## 2016-03-31 DIAGNOSIS — T3992XA Poisoning by unspecified nonopioid analgesic, antipyretic and antirheumatic, intentional self-harm, initial encounter: Secondary | ICD-10-CM | POA: Diagnosis not present

## 2016-03-31 DIAGNOSIS — Y929 Unspecified place or not applicable: Secondary | ICD-10-CM | POA: Diagnosis not present

## 2016-03-31 DIAGNOSIS — Y999 Unspecified external cause status: Secondary | ICD-10-CM | POA: Diagnosis not present

## 2016-03-31 DIAGNOSIS — R0789 Other chest pain: Secondary | ICD-10-CM | POA: Diagnosis not present

## 2016-03-31 DIAGNOSIS — T426X2A Poisoning by other antiepileptic and sedative-hypnotic drugs, intentional self-harm, initial encounter: Secondary | ICD-10-CM | POA: Diagnosis not present

## 2016-03-31 DIAGNOSIS — T43202A Poisoning by unspecified antidepressants, intentional self-harm, initial encounter: Secondary | ICD-10-CM | POA: Diagnosis present

## 2016-03-31 DIAGNOSIS — Z791 Long term (current) use of non-steroidal anti-inflammatories (NSAID): Secondary | ICD-10-CM | POA: Diagnosis not present

## 2016-03-31 DIAGNOSIS — T39395A Adverse effect of other nonsteroidal anti-inflammatory drugs [NSAID], initial encounter: Secondary | ICD-10-CM

## 2016-03-31 DIAGNOSIS — R079 Chest pain, unspecified: Secondary | ICD-10-CM | POA: Diagnosis not present

## 2016-03-31 HISTORY — DX: Pure hypercholesterolemia, unspecified: E78.00

## 2016-03-31 LAB — BASIC METABOLIC PANEL
ANION GAP: 7 (ref 5–15)
BUN: 26 mg/dL — ABNORMAL HIGH (ref 6–20)
CALCIUM: 8.8 mg/dL — AB (ref 8.9–10.3)
CO2: 25 mmol/L (ref 22–32)
CREATININE: 0.99 mg/dL (ref 0.61–1.24)
Chloride: 105 mmol/L (ref 101–111)
Glucose, Bld: 188 mg/dL — ABNORMAL HIGH (ref 65–99)
Potassium: 4.1 mmol/L (ref 3.5–5.1)
SODIUM: 137 mmol/L (ref 135–145)

## 2016-03-31 LAB — RAPID URINE DRUG SCREEN, HOSP PERFORMED
AMPHETAMINES: NOT DETECTED
Barbiturates: NOT DETECTED
Benzodiazepines: POSITIVE — AB
Cocaine: POSITIVE — AB
OPIATES: NOT DETECTED
TETRAHYDROCANNABINOL: NOT DETECTED

## 2016-03-31 LAB — CBC
HCT: 40.8 % (ref 39.0–52.0)
HEMOGLOBIN: 13.6 g/dL (ref 13.0–17.0)
MCH: 31.2 pg (ref 26.0–34.0)
MCHC: 33.3 g/dL (ref 30.0–36.0)
MCV: 93.6 fL (ref 78.0–100.0)
PLATELETS: 255 10*3/uL (ref 150–400)
RBC: 4.36 MIL/uL (ref 4.22–5.81)
RDW: 14.2 % (ref 11.5–15.5)
WBC: 12.6 10*3/uL — ABNORMAL HIGH (ref 4.0–10.5)

## 2016-03-31 LAB — ACETAMINOPHEN LEVEL

## 2016-03-31 LAB — HEPATIC FUNCTION PANEL
ALT: 22 U/L (ref 17–63)
AST: 23 U/L (ref 15–41)
Albumin: 4.5 g/dL (ref 3.5–5.0)
Alkaline Phosphatase: 69 U/L (ref 38–126)
BILIRUBIN TOTAL: 0.6 mg/dL (ref 0.3–1.2)
Total Protein: 7.1 g/dL (ref 6.5–8.1)

## 2016-03-31 LAB — TROPONIN I

## 2016-03-31 LAB — ETHANOL: ALCOHOL ETHYL (B): 9 mg/dL — AB (ref <5)

## 2016-03-31 LAB — SALICYLATE LEVEL: Salicylate Lvl: <7 mg/dL (ref 2.8–30.0)

## 2016-03-31 LAB — LIPASE, BLOOD: Lipase: 19 U/L (ref 11–51)

## 2016-03-31 MED ORDER — LORAZEPAM 1 MG PO TABS: 0.0000 mg | ORAL_TABLET | Freq: Four times a day (QID) | ORAL | Status: DC

## 2016-03-31 MED ORDER — LANSOPRAZOLE 30 MG PO CPDR: 30.0000 mg | DELAYED_RELEASE_CAPSULE | Freq: Every day | ORAL | 0 refills | Status: AC

## 2016-03-31 MED ORDER — ONDANSETRON HCL 4 MG PO TABS: 4.0000 mg | ORAL_TABLET | Freq: Three times a day (TID) | ORAL | Status: DC | PRN

## 2016-03-31 MED ORDER — GI COCKTAIL ~~LOC~~
30.0000 mL | Freq: Once | ORAL | Status: DC
  Filled 2016-03-31: qty 30

## 2016-03-31 MED ORDER — ACETAMINOPHEN 325 MG PO TABS: 650.0000 mg | ORAL_TABLET | ORAL | Status: DC | PRN

## 2016-03-31 MED ORDER — LORAZEPAM 1 MG PO TABS
0.0000 mg | ORAL_TABLET | Freq: Two times a day (BID) | ORAL | Status: DC
Start: 2016-04-02 — End: 2016-04-01

## 2016-03-31 NOTE — Discharge Instructions (Signed)
Patient needs close observation and suicide precautions. He needs to be evaluated by mental health specialist. Patient needs follow-up with a gastroenterologist should his upper abdominal pain persists. Patient should return immediately for vomiting blood, worsening pain, dark, tarry stool or for any concerns.

## 2016-03-31 NOTE — ED Notes (Signed)
Poison control called for update and is closing case.

## 2016-03-31 NOTE — ED Provider Notes (Signed)
[facility] DEPT Provider Note   CSN: [id number] Arrival date & time: 03/31/16  1338     History   Chief Complaint Chief Complaint  Patient presents with  . Chest Pain  . Suicidal    HPI Jason Rich is a 35 y.o. male.  HPI Patient was arrested by police for domestic assault and kidnapping. Admitted to taking multiple medications in an attempt to harm himself at 5 AM. States he took roughly 10 gabapentin, mobic and trazodone that was recently prescribed to him for sciatica. Patient states that he was trying to kill himself. States that he will kill himself when he is released. Has not attempted suicide in the past. Denies any other coingestion. Denies alcohol or recent drug use. Began having a "prickly" sensation in his central chest and upper abdominal pain shortly after taking medication. Describes the abdominal pain as burning. No nausea or vomiting. Past Medical History:  Diagnosis Date  . High cholesterol   . Hypertension     Patient Active Problem List   Diagnosis Date Noted  . MDD (major depressive disorder), recurrent severe, without psychosis (HCC) 01/29/2016  . 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 Fracture of finger, distal phalanx, right, open 05/30/2013  . Pain in joint, hand 05/30/2013    Past Surgical History:  Procedure Laterality Date  . FINGER SURGERY Right        Home Medications    Prior to Admission medications   Medication Sig Start Date End Date Taking? Authorizing Provider  cyclobenzaprine (FLEXERIL) 10 MG tablet Take 1 tablet (10 mg total) by mouth 3 (three) times daily. 03/27/16  Yes Ivery QualeHobson Bryant, PA-C  dexamethasone (DECADRON) 4 MG tablet Take 1 tablet (4 mg total) by mouth 2 (two) times daily with a meal. 03/27/16  Yes Ivery QualeHobson Bryant, PA-C  diclofenac (VOLTAREN) 75 MG EC tablet Take 1 tablet (75 mg total) by mouth 2 (two)  times daily. 03/27/16  Yes Ivery QualeHobson Bryant, PA-C  DULoxetine (CYMBALTA) 20 MG capsule Take 1 capsule (20 mg total) by mouth 2 (two) times daily. 01/29/16  Yes Beau FannyJohn C Withrow, FNP  gabapentin (NEURONTIN) 400 MG capsule Take 1 capsule (400 mg total) by mouth 3 (three) times daily. Patient taking differently: Take 600 mg by mouth 3 (three) times daily.  01/29/16  Yes Beau FannyJohn C Withrow, FNP  hydrOXYzine (ATARAX/VISTARIL) 25 MG tablet Take 1 tablet (25 mg total) by mouth every 6 (six) hours as needed for anxiety. 01/29/16  Yes Beau FannyJohn C Withrow, FNP  lisinopril (PRINIVIL,ZESTRIL) 20 MG tablet Take 20 mg by mouth daily. 12/11/15  Yes Historical Provider, MD  pravastatin (PRAVACHOL) 20 MG tablet Take 20 mg by mouth daily. 12/10/15  Yes Historical Provider, MD  traZODone (DESYREL) 50 MG tablet Take 1 tablet (50 mg total) by mouth at bedtime as needed for sleep. 01/29/16  Yes Beau FannyJohn C Withrow, FNP  lansoprazole (PREVACID) 30 MG capsule Take 1 capsule (30 mg total) by mouth daily at 12 noon. 03/31/16   Loren Raceravid Coila Wardell, MD    Family History History reviewed. No pertinent family history.  Social History Social History  Substance Use Topics  . Smoking status: Current Some Day Smoker    Packs/day: 0.50    Types: Cigarettes  . Smokeless tobacco: Never Used     Comment: Patient refused cessation materials  . Alcohol use No     Comment: States he has only had 2  or 3 beers in last 2 years     Allergies   Review of patient's allergies indicates no known allergies.   Review of Systems Review of Systems  Constitutional: Negative for chills and fever.  Respiratory: Negative for shortness of breath.   Cardiovascular: Positive for chest pain.  Gastrointestinal: Positive for abdominal pain. Negative for diarrhea, nausea and vomiting.  Musculoskeletal: Negative for back pain, myalgias, neck pain and neck stiffness.  Skin: Negative for rash and wound.  Neurological: Negative for dizziness, weakness, light-headedness,  numbness and headaches.  Psychiatric/Behavioral: Positive for suicidal ideas.  All other systems reviewed and are negative.    Physical Exam Updated Vital Signs BP (!) 140/101   Pulse 75   Temp 98.8 F (37.1 C) (Oral)   Resp 16   Ht 5\' 5"  (1.651 m)   Wt 175 lb (79.4 kg)   SpO2 100%   BMI 29.12 kg/m   Physical Exam  Constitutional: He is oriented to person, place, and time. He appears well-developed and well-nourished.  HENT:  Head: Normocephalic and atraumatic.  Mouth/Throat: Oropharynx is clear and moist.  Eyes: EOM are normal. Pupils are equal, round, and reactive to light.  Neck: Normal range of motion. Neck supple.  Cardiovascular: Normal rate and regular rhythm.  Exam reveals no gallop and no friction rub.   No murmur heard. Pulmonary/Chest: Effort normal and breath sounds normal. No respiratory distress. He has no wheezes. He has no rales. He exhibits tenderness.  Diffuse chest wall tenderness specifically in the sternal and parasternal regions. No crepitance or deformity.  Abdominal: Soft. Bowel sounds are normal. He exhibits no distension and no mass. There is tenderness (epigastric tenderness with palpation.). There is no guarding.  Musculoskeletal: Normal range of motion. He exhibits no edema or tenderness.  Neurological: He is alert and oriented to person, place, and time.  Moving all extremities without deficit. Sensation is fully intact.  Skin: Skin is warm and dry. Capillary refill takes less than 2 seconds. No rash noted. No erythema.  Psychiatric: He has a normal mood and affect. His behavior is normal.  Nursing note and vitals reviewed.    ED Treatments / Results  Labs (all labs ordered are listed, but only abnormal results are displayed) Labs Reviewed  BASIC METABOLIC PANEL - Abnormal; Notable for the following:       Result Value   Glucose, Bld 188 (*)    BUN 26 (*)    Calcium 8.8 (*)    All other components within normal limits  CBC - Abnormal;  Notable for the following:    WBC 12.6 (*)    All other components within normal limits  ETHANOL - Abnormal; Notable for the following:    Alcohol, Ethyl (B) 9 (*)    All other components within normal limits  ACETAMINOPHEN LEVEL - Abnormal; Notable for the following:    Acetaminophen (Tylenol), Serum <10 (*)    All other components within normal limits  RAPID URINE DRUG SCREEN, HOSP PERFORMED - Abnormal; Notable for the following:    Cocaine POSITIVE (*)    Benzodiazepines POSITIVE (*)    All other components within normal limits  HEPATIC FUNCTION PANEL - Abnormal; Notable for the following:    Bilirubin, Direct <0.1 (*)    All other components within normal limits  TROPONIN I  SALICYLATE LEVEL  LIPASE, BLOOD  TROPONIN I    EKG  EKG Interpretation  Date/Time:  Tuesday March 31 2016 13:47:25 EDT Ventricular Rate:  69  PR Interval:    QRS Duration: 88 QT Interval:  348 QTC Calculation: 407 R Axis:   64 Text Interpretation:  Sinus rhythm Borderline short PR interval ST elev, probable normal early repol pattern Confirmed by Ranae Palms  MD, Prabhjot Piscitello ([id number]) on 03/31/2016 2:48:13 PM       Radiology Dg Chest 2 View  Result Date: 03/31/2016 CLINICAL DATA:  Chest pain EXAM: CHEST  2 VIEW COMPARISON:  None. FINDINGS: The heart size and mediastinal contours are within normal limits. Both lungs are clear. The visualized skeletal structures are unremarkable. IMPRESSION: No active cardiopulmonary disease. Electronically Signed   By: Marlan Palau M.D.   On: 03/31/2016 14:11    Procedures Procedures (including critical care time)  Medications Ordered in ED Medications  gi cocktail (Maalox,Lidocaine,Donnatal) (30 mLs Oral Refused 03/31/16 1508)  LORazepam (ATIVAN) tablet 0-4 mg (not administered)    Followed by  LORazepam (ATIVAN) tablet 0-4 mg (not administered)  acetaminophen (TYLENOL) tablet 650 mg (not administered)  ondansetron (ZOFRAN) tablet 4 mg (not administered)      Initial Impression / Assessment and Plan / ED Course  I have reviewed the triage vital signs and the nursing notes.  Pertinent labs & imaging results that were available during my care of the patient were reviewed by me and considered in my medical decision making (see chart for details).  Clinical Course  Discussed with poison control. Recommended 4 hour observation. Patient refused GI cocktail.  The patient's been observed in the emergency department now for greater than 4 hours. His vital signs remained stable. His troponin 2 are normal. EKG without ischemic changes. The patient is medically cleared for psychiatric evaluation. We'll have TTS evaluate. Holding orders have been placed.  TTS evaluate the patient. Discussed with PA. Meets inpatient criteria. Given circumstances will be discharged to police officers to be taken to jail. Understand the patient is a suicidal threat and needs close observation. Understand patient also needs psychiatric consultation while in jail. Patient does likely have NSAID-induced gastritis. Will start on PPI. Return precautions have been given. Final Clinical Impressions(s) / ED Diagnoses   Final diagnoses:  Suicide attempt by drug ingestion, initial encounter [facility])  Atypical chest pain  NSAID induced gastritis    New Prescriptions New Prescriptions   LANSOPRAZOLE (PREVACID) 30 MG CAPSULE    Take 1 capsule (30 mg total) by mouth daily at 12 noon.     Loren Racer, MD 03/31/16 2035

## 2016-03-31 NOTE — ED Notes (Signed)
Pt admits to taking 7-8 neurontin 400mg .  Some mobic .  trazadone 50 mg 6-7.  cymbalta 3-4 tabs.  Flexeril 10mg  10 tabs.  Decadron unsure of how many.

## 2016-03-31 NOTE — ED Notes (Addendum)
Called poison control and advised of overdose.  Flexeril 10 mg tablets--look for sedation ,  Mild tachycardi and urinary retention.  For decadron no effects.   For voltaren or Mobic---GI symptoms.   Cymbalta---CNS depression,  Gi Symptoms.   Neurontin , no effects.   Trazadone--mild hypotension,  Mild prolonged GTC segment.  Recommend tylenol, aspirin and electrolyte levels.  And make sure pt can void before D/C.

## 2016-03-31 NOTE — ED Notes (Signed)
Pt refused supper tray.  States he just wants to die.

## 2016-03-31 NOTE — ED Notes (Signed)
Pt c/o pain in finger of right hand- this nurse offered Tylenol- pt states, "That is not going to do any good."  Explained to pt that this is the order I have from the doctor..the patient refused Tylenol.

## 2016-03-31 NOTE — ED Notes (Signed)
Pt states he is refusing any more medical care and that he is ready to go to jail.  Dr. Ranae PalmsYelverton notifed.   Per ED physician pt is to be observed for 4-6 hours on cardiac monitor.

## 2016-03-31 NOTE — BH Assessment (Signed)
Assessor [facility] with [facility] in the ED to set up cart in order to complete the TTS assessment.   Princess BruinsAquicha Duff, MSW, Theresia MajorsLCSWA
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
## 2016-04-01 DIAGNOSIS — R0789 Other chest pain: Secondary | ICD-10-CM | POA: Diagnosis not present

## 2016-04-27 NOTE — ED Provider Notes (Signed)
ED Provider Notes Date of Service: 03/27/2016 7:18 PM Jason Rich Jason Cecilio, PA-C  Emergency Medicine  Cosigned by: Samuel JesterKathleen McManus, DO at 03/28/2016 4:49 PM  Attestation signed by Samuel JesterKathleen McManus, DO at 03/28/2016 4:49 PM  Medical screening examination/treatment/procedure(s) were performed by non-physician practitioner and as supervising physician I was immediately available for consultation/collaboration.       EKG Interpretation None         Expand All Collapse All   [] Hide copied text [] Hover for attribution information  [facility] Provider Note   CSN: [id number] Arrival date & time: 03/27/16  1754     History              Chief Complaint    Chief Complaint  Patient presents with  . Back Pain    HPI Jason SkeenBenito Rich is a 35 y.o. male.  Patient is a 35 year old male who presents to the emergency department with complaint of back pain.  Patient gives history that on October 9, he was at work, pulling up plants when he began to have some mild pain in his back. The pain got worse after he got home. The following day it was progressing even more. The patient continues to have pain throughout the week. He presents now for evaluation concerning his lower back. The pain is aggravated by certain movements. He gets some relief from rest and being still, but it does not resolve his pain. There's been no loss of bowel or bladder function. The patient has no cough or respiratory related illness. No previous operations or procedures involving the lower back.  Back Pain   Pertinent negatives include no chest pain, no abdominal pain and no dysuria.        Past Medical History:  Diagnosis Date  . Hypertension         Patient Active Problem List   Diagnosis Date Noted  . MDD (major depressive disorder), recurrent severe, without psychosis (HCC) 01/29/2016  . 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 Fracture of finger, distal phalanx, right, open 05/30/2013  . Pain in joint, hand 05/30/2013         Past Surgical History:  Procedure Laterality Date  . FINGER SURGERY Right        Home Medications                      Prior to Admission medications   Medication Sig Start Date End Date Taking? Authorizing Provider  DULoxetine (CYMBALTA) 20 MG capsule Take 1 capsule (20 mg total) by mouth 2 (two) times daily. 01/29/16   Beau FannyJohn C Withrow, FNP  gabapentin (NEURONTIN) 400 MG capsule Take 1 capsule (400 mg total) by mouth 3 (three) times daily. 01/29/16   Beau FannyJohn C Withrow, FNP  hydrOXYzine (ATARAX/VISTARIL) 25 MG tablet Take 1 tablet (25 mg total) by mouth every 6 (six) hours as needed for anxiety. 01/29/16   Beau FannyJohn C Withrow, FNP  lisinopril (PRINIVIL,ZESTRIL) 20 MG tablet Take 20 mg by mouth daily. 12/11/15   Historical Provider, MD  pravastatin (PRAVACHOL) 20 MG tablet Take 20 mg by mouth daily. 12/10/15   Historical Provider, MD  traZODone (DESYREL) 50 MG tablet Take 1 tablet (50 mg total) by mouth at bedtime as needed for sleep. 01/29/16   Beau FannyJohn C Withrow, FNP    Family History No family history on file.  Social History  Social History  Substance Use Topics  . Smoking status: Current Some Day Smoker    Packs/day: 0.50    Types: Cigarettes  . Smokeless tobacco: Never Used     Comment: Patient refused cessation materials  . Alcohol use No     Comment: States he has only had 2 or 3 beers in last 2 years     Allergies           Review of patient's allergies indicates no known allergies.   Review of Systems Review of Systems  Constitutional: Negative for activity change.       All ROS Neg except as noted in HPI  HENT: Negative for nosebleeds.   Eyes: Negative for photophobia and discharge.  Respiratory: Negative for cough, shortness of breath and wheezing.   Cardiovascular:  Negative for chest pain and palpitations.  Gastrointestinal: Negative for abdominal pain and blood in stool.  Genitourinary: Negative for dysuria, frequency and hematuria.  Musculoskeletal: Positive for back pain. Negative for arthralgias and neck pain.  Skin: Negative.   Neurological: Negative for dizziness, seizures and speech difficulty.  Psychiatric/Behavioral: Negative for confusion and hallucinations.       Depression     Physical Exam Updated Vital Signs BP 125/70 (BP Location: Left Arm)   Pulse 94   Temp 98.7 F (37.1 C) (Oral)   Resp 16   Ht 5\' 5"  (1.651 m)   Wt 79.4 kg   SpO2 99%   BMI 29.12 kg/m   Physical Exam Physical Exam  Constitutional: Vital signs are normal. He appears well-developed and well-nourished. He is active.  HENT:  Head: Normocephalic and atraumatic.  Right Ear: Tympanic membrane, external ear and ear canal normal.  Left Ear: Tympanic membrane, external ear and ear canal normal.  Nose: Nose normal.  Mouth/Throat: Uvula is midline, oropharynx is clear and moist and mucous membranes are normal.  Eyes: Conjunctivae, EOM and lids are normal. Pupils are equal, round, and reactive to light.  Neck: Trachea normal, normal range of motion and phonation normal. Neck supple. Carotid bruit is not present.  Cardiovascular: Normal rate, regular rhythm and normal pulses.   Abdominal: Soft. Normal appearance and bowel sounds are normal.  Musculoskeletal:       Lumbar back: He exhibits decreased range of motion, tenderness and pain. He exhibits no deformity.  Lymphadenopathy:       Head (right side): No submental, no preauricular and no posterior auricular adenopathy present.       Head (left side): No submental, no preauricular and no posterior auricular adenopathy present.    He has no cervical adenopathy.  Neurological: He is alert. He has normal strength. No cranial nerve deficit or sensory deficit. Coordination normal. GCS eye subscore is 4. GCS verbal  subscore is 5. GCS motor subscore is 6.  Skin: Skin is warm and dry.  Psychiatric: His speech is normal.    ED Treatments / Results  Labs (all labs ordered are listed, but only abnormal results are displayed) Labs Reviewed - No data to display  EKG      EKG Interpretation None      Radiology ImagingResults(Last48hours)  No results found.    Procedures Procedures (including critical care time)  Medications Ordered in ED Medications - No data to display   Initial Impression / Assessment and Plan / ED Course  I have reviewed the triage vital signs and the nursing notes.  Pertinent labs & imaging results that were available during my care of the  patient were reviewed by me and considered in my medical decision making (see chart for details).  Clinical Course    *I have reviewed nursing notes, vital signs, and all appropriate lab and imaging results for this patient.**  Final Clinical Impressions(s) / ED Diagnoses No hx of loss of bowel or bladder function. No hx of caudal equina symptoms. CT scan of the lumbar spine shows some mild SI joint degenerative changes, but otherwise within normal limits. The patient is fitted with crutches. He is prescribed Flexeril, Decadron, diclofenac. He is referred to Dr. Charlann Boxerlin for orthopedic evaluation and management of this issue. Questions were answered. Feel that it is safe for the patient be discharged home.    Final diagnoses:  None    New Prescriptions    New Prescriptions   No medications on file     Jason Rich Silvino Selman, PA-C 03/27/16 2127    Samuel JesterKathleen McManus, DO 03/28/16 1649     Electronically signed by Jason Rich Shanekia Latella, PA-C at 03/27/2016 9:27 PM Electronically signed by Samuel JesterKathleen McManus, DO at 03/28/2016 4:49 PM      ED on 03/27/2016        Revision History        Detailed Report        Jason Rich Padraic Marinos, PA-C 04/27/16 1844    Samuel JesterKathleen McManus, DO 04/29/16 1046

## 2016-05-18 ENCOUNTER — Emergency Department (HOSPITAL_COMMUNITY)

## 2016-05-18 ENCOUNTER — Encounter (HOSPITAL_COMMUNITY): Payer: Self-pay | Admitting: Emergency Medicine

## 2016-05-18 ENCOUNTER — Emergency Department (HOSPITAL_COMMUNITY)
Admission: EM | Admit: 2016-05-18 | Discharge: 2016-05-18 | Disposition: A | Discharge status: Home / Self Care | Attending: Emergency Medicine | Admitting: Emergency Medicine

## 2016-05-18 DIAGNOSIS — Z79899 Other long term (current) drug therapy: Secondary | ICD-10-CM | POA: Diagnosis not present

## 2016-05-18 DIAGNOSIS — I10 Essential (primary) hypertension: Secondary | ICD-10-CM | POA: Insufficient documentation

## 2016-05-18 DIAGNOSIS — M5416 Radiculopathy, lumbar region: Secondary | ICD-10-CM | POA: Diagnosis not present

## 2016-05-18 DIAGNOSIS — F17210 Nicotine dependence, cigarettes, uncomplicated: Secondary | ICD-10-CM | POA: Insufficient documentation

## 2016-05-18 LAB — BASIC METABOLIC PANEL
ANION GAP: 7 (ref 5–15)
BUN: 10 mg/dL (ref 6–20)
CO2: 26 mmol/L (ref 22–32)
Calcium: 9.6 mg/dL (ref 8.9–10.3)
Chloride: 107 mmol/L (ref 101–111)
Creatinine, Ser: 0.82 mg/dL (ref 0.61–1.24)
GLUCOSE: 93 mg/dL (ref 65–99)
POTASSIUM: 3.9 mmol/L (ref 3.5–5.1)
Sodium: 140 mmol/L (ref 135–145)

## 2016-05-18 LAB — URINALYSIS, ROUTINE W REFLEX MICROSCOPIC
Bilirubin Urine: NEGATIVE
GLUCOSE, UA: NEGATIVE mg/dL
Hgb urine dipstick: NEGATIVE
Ketones, ur: NEGATIVE mg/dL
LEUKOCYTES UA: NEGATIVE
Nitrite: NEGATIVE
PH: 6.5 (ref 5.0–8.0)
PROTEIN: NEGATIVE mg/dL
Specific Gravity, Urine: 1.01 (ref 1.005–1.030)

## 2016-05-18 MED ORDER — LISINOPRIL 20 MG PO TABS: 20.0000 mg | ORAL_TABLET | Freq: Every day | ORAL | 1 refills | Status: AC

## 2016-05-18 MED ORDER — NAPROXEN 250 MG PO TABS
500.0000 mg | ORAL_TABLET | Freq: Once | ORAL | Status: AC
  Administered 2016-05-18: 500 mg via ORAL
  Filled 2016-05-18: qty 2

## 2016-05-18 MED ORDER — GABAPENTIN 400 MG PO CAPS: 400.0000 mg | ORAL_CAPSULE | Freq: Three times a day (TID) | ORAL | 0 refills | Status: AC

## 2016-05-18 MED ORDER — METHOCARBAMOL 500 MG PO TABS
500.0000 mg | ORAL_TABLET | Freq: Once | ORAL | Status: AC
  Administered 2016-05-18: 500 mg via ORAL
  Filled 2016-05-18: qty 1

## 2016-05-18 MED ORDER — CYCLOBENZAPRINE HCL 10 MG PO TABS: 10.0000 mg | ORAL_TABLET | Freq: Three times a day (TID) | ORAL | 0 refills | Status: AC

## 2016-05-18 MED ORDER — NAPROXEN 500 MG PO TABS: 500.0000 mg | ORAL_TABLET | Freq: Two times a day (BID) | ORAL | 0 refills | Status: AC

## 2016-05-18 NOTE — Discharge Instructions (Signed)
Xrays shows no signs of severe trauma or disk disease. Your symptoms could be RADICULAR PAIN / NERVE IMPINGEMENT PAIN. Take the meds prescribed and start the exercises.  Start the BP medicine as well.

## 2016-05-18 NOTE — ED Provider Notes (Signed)
[facility] Provider Note   CSN: [id number] Arrival date & time: 05/18/16  1417     History   Chief Complaint Chief Complaint  Patient presents with  . Hypertension    HPI Jason Rich is a 35 y.o. male.  HPI Pt comes in with cc of elevated BP and back pain. Pt reports that he has hx of hypertension, he used to take lisinopril q daily and stopped it, and the prison hasnt restarted the medicine. Pt also c/o back pain. Back pain started 3 weeks ago, after pt fell from his bed and hit the toilet. Pt reports that overtime the back pain has gotten worse. The pain is located in the lower part of his spine and he has radiation of the pain going down the L lower extremity. Pt also has some bursning sensation in his hip region. He has no hx of back problems. Recently the pain has gotten worse. Pain is typically aggravated by certain positions. No associated new weakness, urinary incontinence, urinary retention, bowel incontinence, numbness/tingling by the rectum.    Past Medical History:  Diagnosis Date  . High cholesterol   . Hypertension     Patient Active Problem List   Diagnosis Date Noted  . MDD (major depressive disorder), recurrent severe, without psychosis (HCC) 01/29/2016  . 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 Fracture of finger, distal phalanx, right, open 05/30/2013  . Pain in joint, hand 05/30/2013    Past Surgical History:  Procedure Laterality Date  . FINGER SURGERY Right        Home Medications    Prior to Admission medications   Medication Sig Start Date End Date Taking? Authorizing Provider  cyclobenzaprine (FLEXERIL) 10 MG tablet Take 1 tablet (10 mg total) by mouth 3 (three) times daily. 05/18/16   Derwood Kaplan, MD  dexamethasone (DECADRON) 4 MG tablet Take 1 tablet (4 mg total) by mouth 2 (two) times daily with a meal. 03/27/16    Ivery Quale, PA-C  diclofenac (VOLTAREN) 75 MG EC tablet Take 1 tablet (75 mg total) by mouth 2 (two) times daily. 03/27/16   Ivery Quale, PA-C  DULoxetine (CYMBALTA) 20 MG capsule Take 1 capsule (20 mg total) by mouth 2 (two) times daily. 01/29/16   Beau Fanny, FNP  gabapentin (NEURONTIN) 400 MG capsule Take 1 capsule (400 mg total) by mouth 3 (three) times daily. 05/18/16   Derwood Kaplan, MD  hydrOXYzine (ATARAX/VISTARIL) 25 MG tablet Take 1 tablet (25 mg total) by mouth every 6 (six) hours as needed for anxiety. 01/29/16   Beau Fanny, FNP  lansoprazole (PREVACID) 30 MG capsule Take 1 capsule (30 mg total) by mouth daily at 12 noon. 03/31/16   Loren Racer, MD  lisinopril (PRINIVIL,ZESTRIL) 20 MG tablet Take 1 tablet (20 mg total) by mouth daily. 05/18/16   Derwood Kaplan, MD  naproxen (NAPROSYN) 500 MG tablet Take 1 tablet (500 mg total) by mouth 2 (two) times daily. 05/18/16   Derwood Kaplan, MD  pravastatin (PRAVACHOL) 20 MG tablet Take 20 mg by mouth daily. 12/10/15   Historical Provider, MD  traZODone (DESYREL) 50 MG tablet Take 1 tablet (50 mg total) by mouth at bedtime as needed for sleep. 01/29/16   Beau Fanny, FNP    Family History History reviewed. No pertinent family history.  Social History Social History  Substance Use Topics  . Smoking status: Current Some  Day Smoker    Packs/day: 0.50    Types: Cigarettes  . Smokeless tobacco: Never Used     Comment: Patient refused cessation materials  . Alcohol use No     Comment: States he has only had 2 or 3 beers in last 2 years     Allergies   Patient has no known allergies.   Review of Systems Review of Systems  ROS 10 Systems reviewed and are negative for acute change except as noted in the HPI.     Physical Exam Updated Vital Signs BP 151/89   Pulse 62   Temp 98.2 F (36.8 C)   Resp 17   Ht 5\' 5"  (1.651 m)   Wt 174 lb (78.9 kg)   SpO2 98%   BMI 28.96 kg/m   Physical Exam  Constitutional: He  is oriented to person, place, and time. He appears well-developed.  HENT:  Head: Atraumatic.  Neck: Neck supple.  Cardiovascular: Normal rate.   Pulmonary/Chest: Effort normal.  Abdominal: Soft. He exhibits no distension. There is no tenderness.  Musculoskeletal:  Pt has tenderness over the lumbar region No step offs, no erythema. Pt has 2+ patellar reflex bilaterally. Able to discriminate between sharp and dull. Able to ambulate Straight leg raise test is +  Neurological: He is alert and oriented to person, place, and time.  Skin: Skin is warm.  Nursing note and vitals reviewed.    ED Treatments / Results  Labs (all labs ordered are listed, but only abnormal results are displayed) Labs Reviewed  URINALYSIS, ROUTINE W REFLEX MICROSCOPIC (NOT AT [facility])  BASIC METABOLIC PANEL    EKG  EKG Interpretation None       Radiology Dg Lumbar Spine Complete  Result Date: 05/18/2016 CLINICAL DATA:  Fall 1 month ago. Worsening pain mostly on the left and down the left leg. EXAM: LUMBAR SPINE - COMPLETE 4+ VIEW COMPARISON:  CT, 03/27/2016 FINDINGS: There is no evidence of lumbar spine fracture. Alignment is normal. Intervertebral disc spaces are maintained. IMPRESSION: Negative. Electronically Signed   By: Amie Portlandavid  Ormond M.D.   On: 05/18/2016 19:01    Procedures Procedures (including critical care time)  Medications Ordered in ED Medications  naproxen (NAPROSYN) tablet 500 mg (500 mg Oral Given 05/18/16 1944)  methocarbamol (ROBAXIN) tablet 500 mg (500 mg Oral Given 05/18/16 1945)     Initial Impression / Assessment and Plan / ED Course  I have reviewed the triage vital signs and the nursing notes.  Pertinent labs & imaging results that were available during my care of the patient were reviewed by me and considered in my medical decision making (see chart for details).  Clinical Course     Pt is having radicular symptoms in his back. We will get Xrays as he is imprisoned and  doesn't have f/u. Also good to get imaging as the precipitating factor is trauma. No red flags to suggest cord compression. Will give meds. Pt also has HTN as his cc. We will start him on his lisinopril 20 mg. Finally, pt c/o low grade fevers - but the prison never sent any vitals and officer reports that temperature was < 100 F. Pt denies nausea, emesis, fevers, chills, chest pains, shortness of breath, cough, headaches, abdominal pain, uti like symptoms. No fevers here. No further ER workup indicated.   Final Clinical Impressions(s) / ED Diagnoses   Final diagnoses:  Essential hypertension  Pain, radicular, lumbar    New Prescriptions New Prescriptions   LISINOPRIL (  PRINIVIL,ZESTRIL) 20 MG TABLET    Take 1 tablet (20 mg total) by mouth daily.   NAPROXEN (NAPROSYN) 500 MG TABLET    Take 1 tablet (500 mg total) by mouth 2 (two) times daily.     Derwood KaplanAnkit Earvin Blazier, MD 05/18/16 2142

## 2016-05-18 NOTE — ED Triage Notes (Signed)
PT c/o lower back pain worsening x2 weeks with no new injury and states had back xrays done x1 month ago after an injury. PT states no B/P x2 months since [facility] and papers from the jail provided states pt needs eval for HTN, low grade fever and back pain.

## 2016-06-02 ENCOUNTER — Emergency Department (HOSPITAL_COMMUNITY)
Admission: EM | Admit: 2016-06-02 | Discharge: 2016-06-02 | Disposition: A | Discharge status: Home / Self Care | Attending: Emergency Medicine | Admitting: Emergency Medicine

## 2016-06-02 ENCOUNTER — Emergency Department (HOSPITAL_COMMUNITY)

## 2016-06-02 ENCOUNTER — Encounter (HOSPITAL_COMMUNITY): Payer: Self-pay

## 2016-06-02 DIAGNOSIS — I10 Essential (primary) hypertension: Secondary | ICD-10-CM | POA: Insufficient documentation

## 2016-06-02 DIAGNOSIS — R072 Precordial pain: Secondary | ICD-10-CM | POA: Diagnosis present

## 2016-06-02 DIAGNOSIS — Z79899 Other long term (current) drug therapy: Secondary | ICD-10-CM | POA: Diagnosis not present

## 2016-06-02 DIAGNOSIS — F17210 Nicotine dependence, cigarettes, uncomplicated: Secondary | ICD-10-CM | POA: Diagnosis not present

## 2016-06-02 DIAGNOSIS — R0789 Other chest pain: Secondary | ICD-10-CM | POA: Diagnosis not present

## 2016-06-02 LAB — CBC
HCT: 43.8 % (ref 39.0–52.0)
HEMOGLOBIN: 14.9 g/dL (ref 13.0–17.0)
MCH: 30.8 pg (ref 26.0–34.0)
MCHC: 34 g/dL (ref 30.0–36.0)
MCV: 90.7 fL (ref 78.0–100.0)
Platelets: 224 10*3/uL (ref 150–400)
RBC: 4.83 MIL/uL (ref 4.22–5.81)
RDW: 12.7 % (ref 11.5–15.5)
WBC: 7.2 10*3/uL (ref 4.0–10.5)

## 2016-06-02 LAB — BASIC METABOLIC PANEL
ANION GAP: 8 (ref 5–15)
BUN: 16 mg/dL (ref 6–20)
CHLORIDE: 102 mmol/L (ref 101–111)
CO2: 25 mmol/L (ref 22–32)
CREATININE: 0.87 mg/dL (ref 0.61–1.24)
Calcium: 9.3 mg/dL (ref 8.9–10.3)
GFR calc non Af Amer: >60 mL/min (ref >60)
GLUCOSE: 93 mg/dL (ref 65–99)
Potassium: 4.4 mmol/L (ref 3.5–5.1)
Sodium: 135 mmol/L (ref 135–145)

## 2016-06-02 LAB — D-DIMER, QUANTITATIVE: D-Dimer, Quant: <0.27 ug/mL-FEU (ref 0.00–0.50)

## 2016-06-02 LAB — TROPONIN I: Troponin I: <0.03 ng/mL (ref <0.03)

## 2016-06-02 MED ORDER — HYDROCODONE-ACETAMINOPHEN 5-325 MG PO TABS
1.0000 | ORAL_TABLET | Freq: Once | ORAL | Status: AC
  Administered 2016-06-02: 1 via ORAL
  Filled 2016-06-02: qty 1

## 2016-06-02 MED ORDER — RANITIDINE HCL 150 MG PO TABS: 150.0000 mg | ORAL_TABLET | Freq: Two times a day (BID) | ORAL | 0 refills | Status: AC

## 2016-06-02 MED ORDER — TRAMADOL HCL 50 MG PO TABS: 50.0000 mg | ORAL_TABLET | Freq: Four times a day (QID) | ORAL | 0 refills | Status: AC | PRN

## 2016-06-02 NOTE — ED Provider Notes (Signed)
[facility] Provider Note   CSN: [id number] Arrival date & time: 06/02/16  1437     History   Chief Complaint Chief Complaint  Patient presents with  . Chest Pain    HPI Jason Rich is a 35 y.o. male.  Patient states 4 days he's been having midsternal chest discomfort. No other symptoms.   The history is provided by the patient. No language interpreter was used.  Chest Pain   This is a new problem. The current episode started more than 2 days ago. The problem occurs constantly. The problem has not changed since onset.The pain is associated with breathing. The pain is present in the substernal region. The pain is at a severity of 4/10. The pain is moderate. The quality of the pain is described as brief. Pertinent negatives include no abdominal pain, no back pain, no cough and no headaches.  Pertinent negatives for past medical history include no seizures.    Past Medical History:  Diagnosis Date  . High cholesterol   . Hypertension     Patient Active Problem List   Diagnosis Date Noted  . MDD (major depressive disorder), recurrent severe, without psychosis (HCC) 01/29/2016  . 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 Fracture of finger, distal phalanx, right, open 05/30/2013  . Pain in joint, hand 05/30/2013    Past Surgical History:  Procedure Laterality Date  . FINGER SURGERY Right        Home Medications    Prior to Admission medications   Medication Sig Start Date End Date Taking? Authorizing Provider  cyclobenzaprine (FLEXERIL) 10 MG tablet Take 1 tablet (10 mg total) by mouth 3 (three) times daily. 05/18/16   Derwood Kaplan, MD  dexamethasone (DECADRON) 4 MG tablet Take 1 tablet (4 mg total) by mouth 2 (two) times daily with a meal. 03/27/16   Ivery Quale, PA-C  diclofenac (VOLTAREN) 75 MG EC tablet Take 1 tablet (75 mg total) by mouth  2 (two) times daily. 03/27/16   Ivery Quale, PA-C  DULoxetine (CYMBALTA) 20 MG capsule Take 1 capsule (20 mg total) by mouth 2 (two) times daily. 01/29/16   Beau Fanny, FNP  gabapentin (NEURONTIN) 400 MG capsule Take 1 capsule (400 mg total) by mouth 3 (three) times daily. 05/18/16   Derwood Kaplan, MD  hydrOXYzine (ATARAX/VISTARIL) 25 MG tablet Take 1 tablet (25 mg total) by mouth every 6 (six) hours as needed for anxiety. 01/29/16   Beau Fanny, FNP  lansoprazole (PREVACID) 30 MG capsule Take 1 capsule (30 mg total) by mouth daily at 12 noon. 03/31/16   Loren Racer, MD  lisinopril (PRINIVIL,ZESTRIL) 20 MG tablet Take 1 tablet (20 mg total) by mouth daily. 05/18/16   Derwood Kaplan, MD  naproxen (NAPROSYN) 500 MG tablet Take 1 tablet (500 mg total) by mouth 2 (two) times daily. 05/18/16   Derwood Kaplan, MD  pravastatin (PRAVACHOL) 20 MG tablet Take 20 mg by mouth daily. 12/10/15   Historical Provider, MD  ranitidine (ZANTAC) 150 MG tablet Take 1 tablet (150 mg total) by mouth 2 (two) times daily. 06/02/16   Bethann Berkshire, MD  traMADol (ULTRAM) 50 MG tablet Take 1 tablet (50 mg total) by mouth every 6 (six) hours as needed. 06/02/16   Bethann Berkshire, MD  traZODone (DESYREL) 50 MG tablet Take 1 tablet (50 mg total) by mouth at bedtime as needed for sleep. 01/29/16  Beau FannyJohn C Withrow, FNP    Family History No family history on file.  Social History Social History  Substance Use Topics  . Smoking status: Current Some Day Smoker    Packs/day: 0.50    Types: Cigarettes  . Smokeless tobacco: Never Used     Comment: Patient refused cessation materials  . Alcohol use No     Comment: States he has only had 2 or 3 beers in last 2 years     Allergies   Patient has no known allergies.   Review of Systems Review of Systems  Constitutional: Negative for appetite change and fatigue.  HENT: Negative for congestion, ear discharge and sinus pressure.   Eyes: Negative for discharge.    Respiratory: Negative for cough.   Cardiovascular: Positive for chest pain.  Gastrointestinal: Negative for abdominal pain and diarrhea.  Genitourinary: Negative for frequency and hematuria.  Musculoskeletal: Negative for back pain.  Skin: Negative for rash.  Neurological: Negative for seizures and headaches.  Psychiatric/Behavioral: Negative for hallucinations.     Physical Exam Updated Vital Signs BP 145/100 (BP Location: Left Arm)   Pulse 81   Temp 98.4 F (36.9 C) (Oral)   Resp 16   Ht 5\' 5"  (1.651 m)   Wt 174 lb (78.9 kg)   SpO2 98%   BMI 28.96 kg/m   Physical Exam  Constitutional: He is oriented to person, place, and time. He appears well-developed.  HENT:  Head: Normocephalic.  Eyes: Conjunctivae and EOM are normal. No scleral icterus.  Neck: Neck supple. No thyromegaly present.  Cardiovascular: Normal rate and regular rhythm.  Exam reveals no gallop and no friction rub.   No murmur heard. Pulmonary/Chest: No stridor. He has no wheezes. He has no rales. He exhibits no tenderness.  Abdominal: He exhibits no distension. There is no tenderness. There is no rebound.  Musculoskeletal: Normal range of motion. He exhibits no edema.  Lymphadenopathy:    He has no cervical adenopathy.  Neurological: He is oriented to person, place, and time. He exhibits normal muscle tone. Coordination normal.  Skin: No rash noted. No erythema.  Psychiatric: He has a normal mood and affect. His behavior is normal.     ED Treatments / Results  Labs (all labs ordered are listed, but only abnormal results are displayed) Labs Reviewed  BASIC METABOLIC PANEL  CBC  TROPONIN I  D-DIMER, QUANTITATIVE (NOT AT [facility])    EKG  EKG Interpretation  Date/Time:  Tuesday June 02 2016 14:45:36 EST Ventricular Rate:  72 PR Interval:  128 QRS Duration: 86 QT Interval:  374 QTC Calculation: 409 R Axis:   -11 Text Interpretation:  Normal sinus rhythm Normal ECG Confirmed by Dae Highley  MD,  Anapaula Severt [phone]([id number]) on 06/02/2016 4:47:00 PM       Radiology Dg Chest 2 View  Result Date: 06/02/2016 CLINICAL DATA:  Chest pain.  Shortness of breath. EXAM: CHEST  2 VIEW COMPARISON:  No recent prior . FINDINGS: Mediastinum and hilar structures normal. Lungs are clear. Calcified pulmonary nodular densities noted consistent granulomas. No focal infiltrate. No pleural effusion or pneumothorax . IMPRESSION: No acute cardiopulmonary disease. Calcified pulmonary nodular densities noted consistent with granulomas. Electronically Signed   By: Maisie Fushomas  Register   On: 06/02/2016 15:01    Procedures Procedures (including critical care time)  Medications Ordered in ED Medications  HYDROcodone-acetaminophen (NORCO/VICODIN) 5-325 MG per tablet 1 tablet (1 tablet Oral Given 06/02/16 1710)     Initial Impression / Assessment and Plan / ED  Course  I have reviewed the triage vital signs and the nursing notes.  Pertinent labs & imaging results that were available during my care of the patient were reviewed by me and considered in my medical decision making (see chart for details).  Clinical Course     Noncardiac chest pain. Patient put on Zantac and Ultram and will follow-up with family doctor  Final Clinical Impressions(s) / ED Diagnoses   Final diagnoses:  Other chest pain    New Prescriptions New Prescriptions   RANITIDINE (ZANTAC) 150 MG TABLET    Take 1 tablet (150 mg total) by mouth 2 (two) times daily.   TRAMADOL (ULTRAM) 50 MG TABLET    Take 1 tablet (50 mg total) by mouth every 6 (six) hours as needed.     Bethann BerkshireJoseph Saumya Hukill, MD 06/02/16 [phone]

## 2016-06-02 NOTE — Discharge Instructions (Signed)
Follow up with a doctor for recheck in 1-2 weeks

## 2016-06-02 NOTE — ED Triage Notes (Signed)
Pt reports chest pain and vomiting x 2 days.  Reports vomited 2 days ago.

## 2017-04-13 IMAGING — DX DG CHEST 2V
2 series · 2 of 2 positions shown · non-contrast
Comparison: None.

CLINICAL DATA: Chest pain

EXAM:
CHEST  2 VIEW

[chest pa]
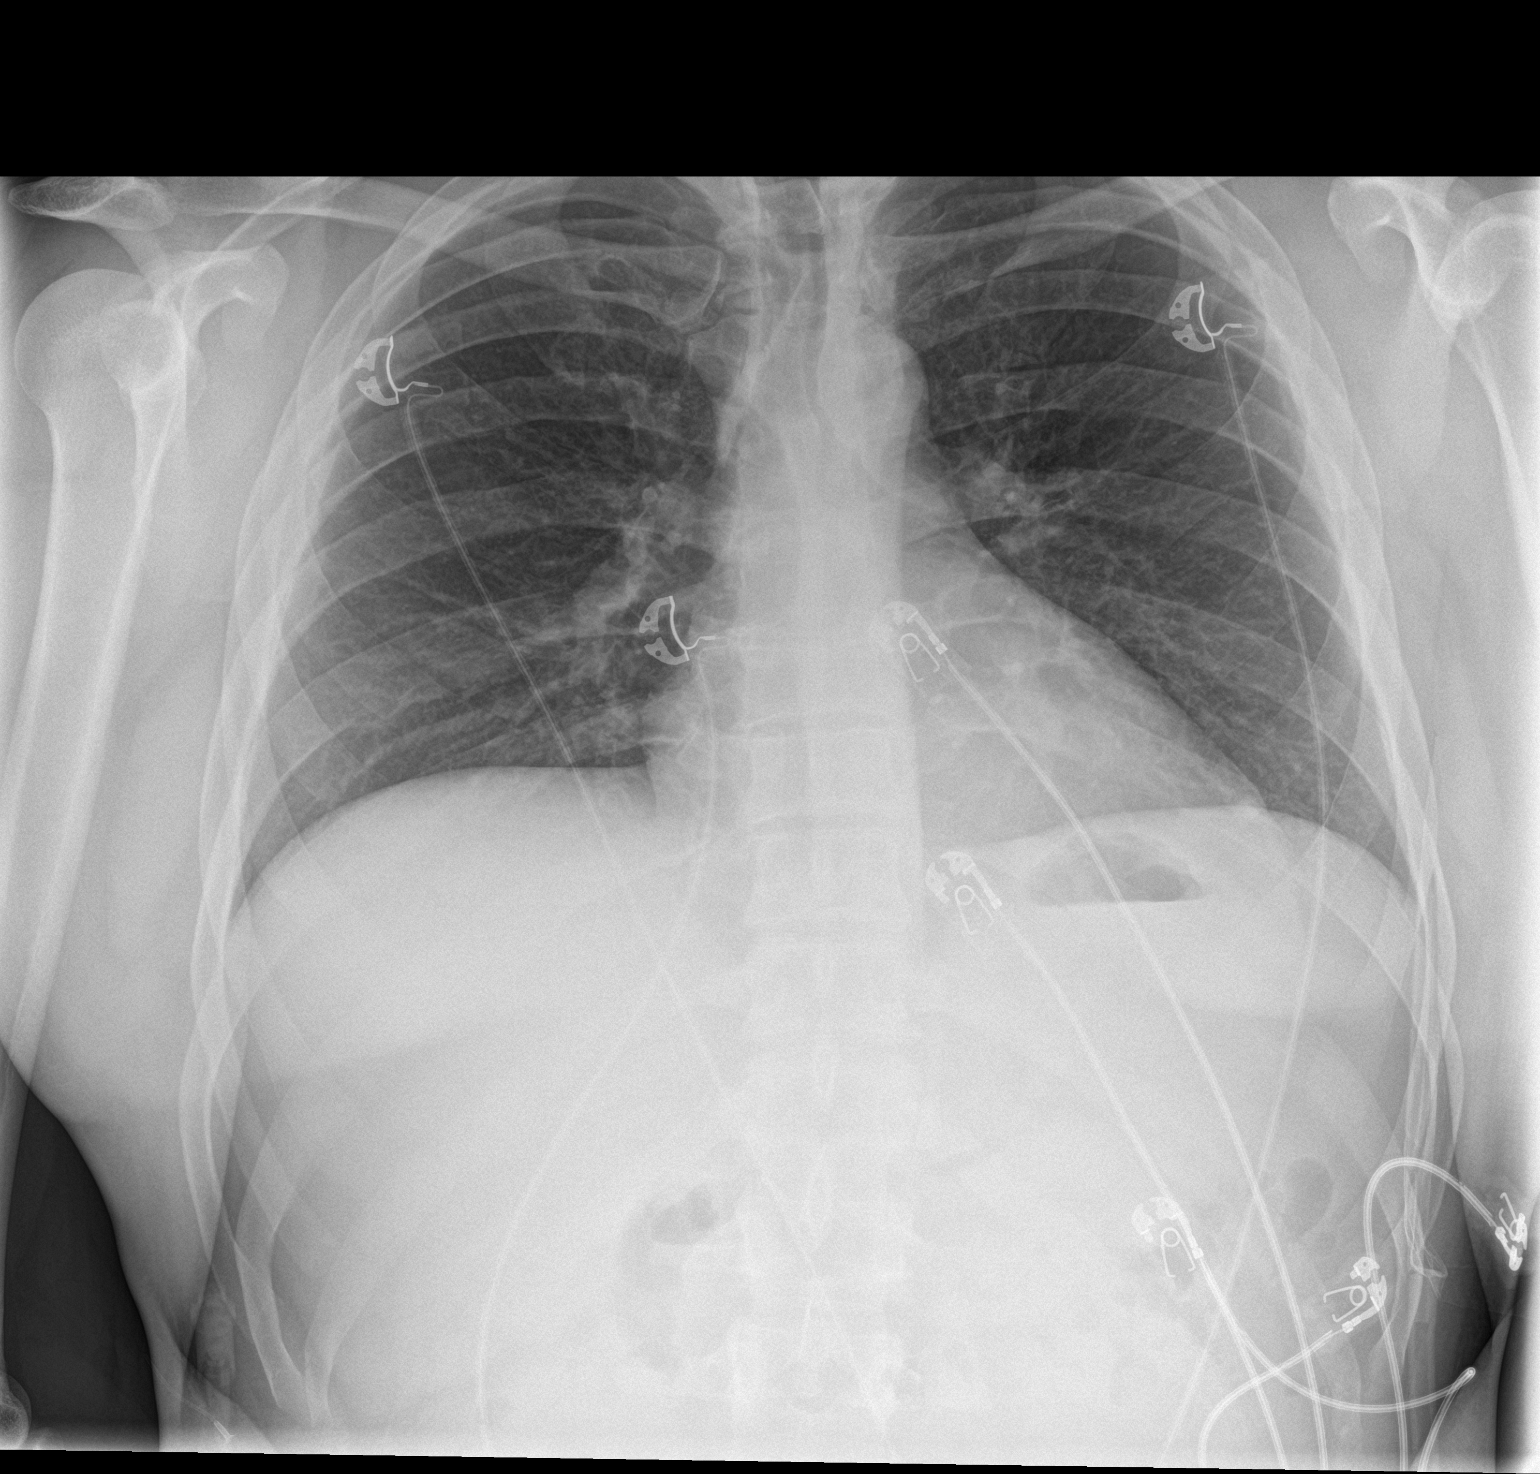

[chest lat]
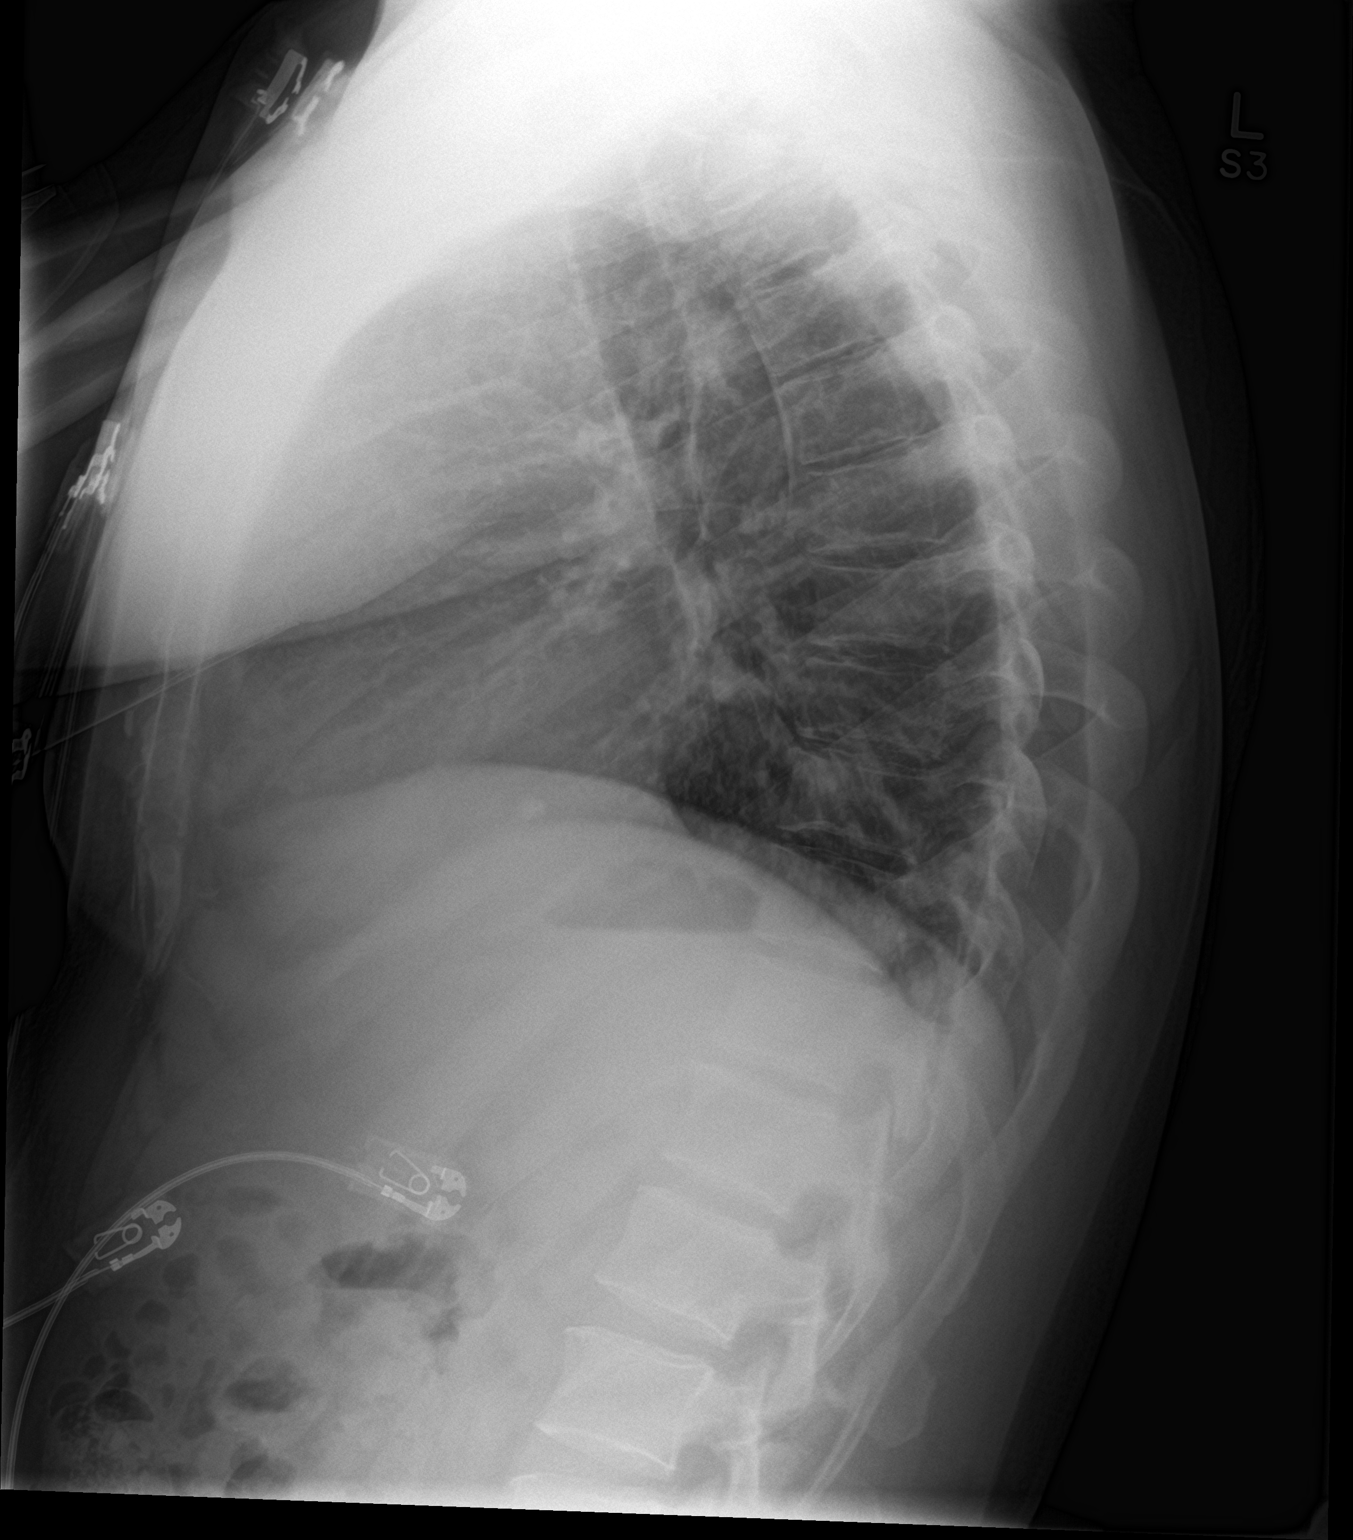

[2 of 2 positions shown; findings below may reference images not displayed]

FINDINGS: The heart size and mediastinal contours are within normal limits.
Both lungs are clear. The visualized skeletal structures are
unremarkable.
IMPRESSION: No active cardiopulmonary disease.

## 2017-06-15 IMAGING — DX DG CHEST 2V
2 series · 2 of 2 positions shown · non-contrast
Comparison: No recent prior .

CLINICAL DATA: Chest pain.  Shortness of breath.

EXAM:
CHEST  2 VIEW

[chest pa]
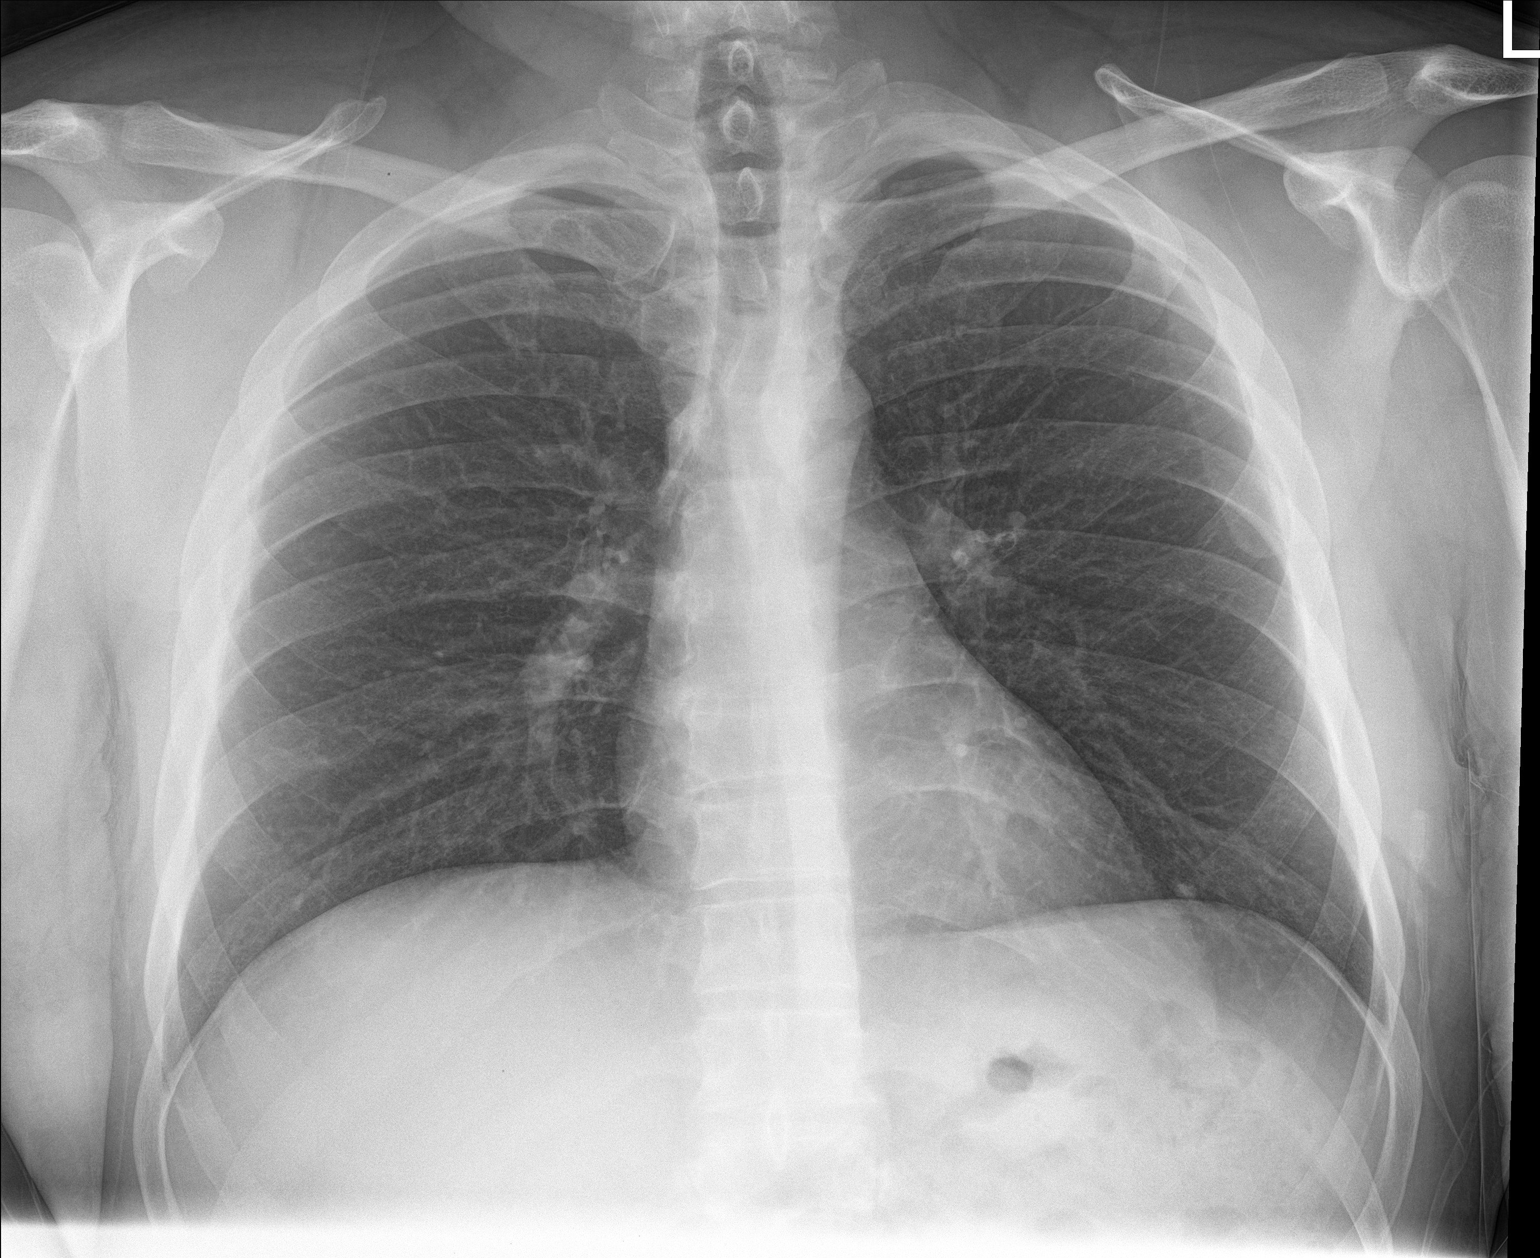

[chest ap]
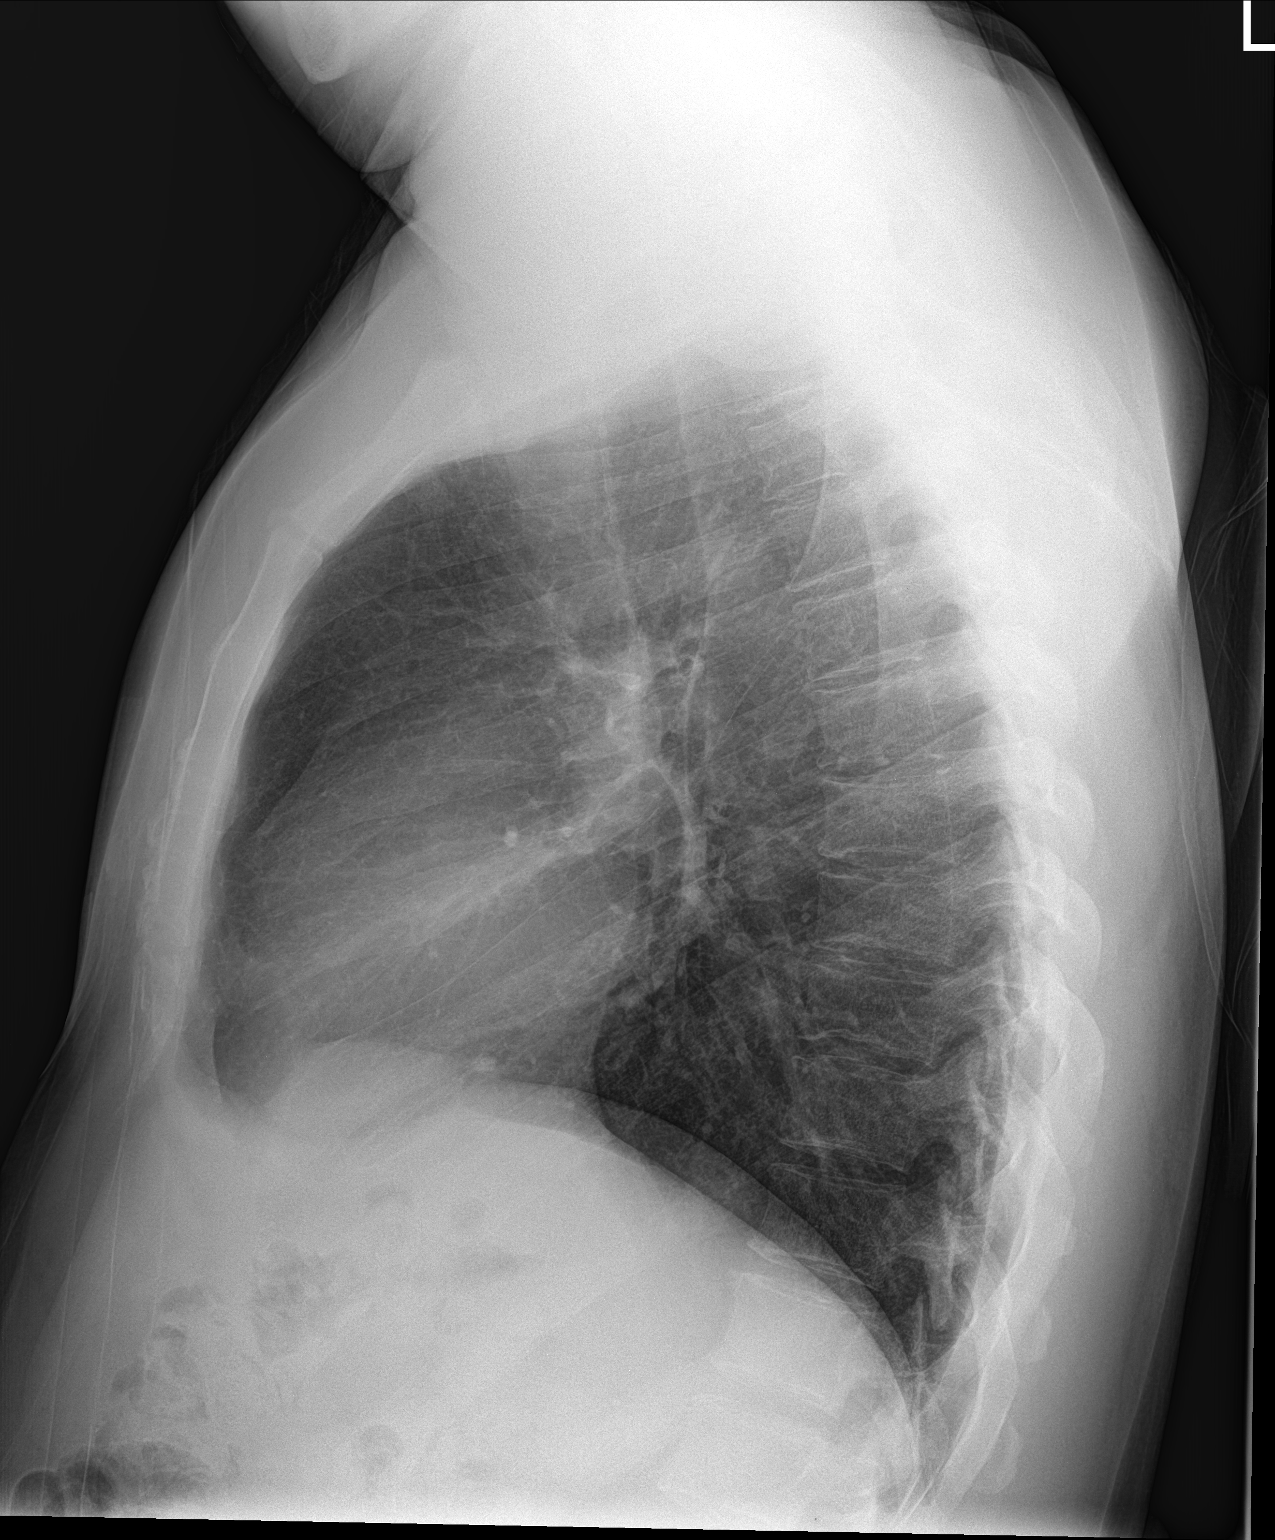

[2 of 2 positions shown; findings below may reference images not displayed]

FINDINGS: Mediastinum and hilar structures normal. Lungs are clear. Calcified
pulmonary nodular densities noted consistent granulomas. No focal
infiltrate. No pleural effusion or pneumothorax .
IMPRESSION: No acute cardiopulmonary disease. Calcified pulmonary nodular
densities noted consistent with granulomas.
